# Patient Record
Sex: Female | Born: 1935 | Race: White | Hispanic: No | Smoking: Former smoker
Health system: Southern US, Community
[De-identification: ages and names within clinical notes are randomized; demographics above are authoritative.]

## PROBLEM LIST (undated history)

## (undated) DIAGNOSIS — H25019 Cortical age-related cataract, unspecified eye: Secondary | ICD-10-CM

## (undated) DIAGNOSIS — I10 Essential (primary) hypertension: Secondary | ICD-10-CM

## (undated) DIAGNOSIS — K219 Gastro-esophageal reflux disease without esophagitis: Secondary | ICD-10-CM

## (undated) DIAGNOSIS — E785 Hyperlipidemia, unspecified: Secondary | ICD-10-CM

## (undated) DIAGNOSIS — E119 Type 2 diabetes mellitus without complications: Secondary | ICD-10-CM

## (undated) DIAGNOSIS — I639 Cerebral infarction, unspecified: Secondary | ICD-10-CM

## (undated) DIAGNOSIS — I491 Atrial premature depolarization: Secondary | ICD-10-CM

## (undated) HISTORY — DX: Cortical age-related cataract, unspecified eye: H25.019

## (undated) HISTORY — DX: Type 2 diabetes mellitus without complications: E11.9

## (undated) HISTORY — DX: Hyperlipidemia, unspecified: E78.5

## (undated) HISTORY — PX: TONSILLECTOMY: SUR1361

## (undated) HISTORY — PX: APPENDECTOMY: SHX54

## (undated) HISTORY — DX: Essential (primary) hypertension: I10

## (undated) HISTORY — DX: Atrial premature depolarization: I49.1

## (undated) HISTORY — DX: Gastro-esophageal reflux disease without esophagitis: K21.9

## (undated) HISTORY — PX: CHOLECYSTECTOMY: SHX55

## (undated) HISTORY — PX: FRACTURE SURGERY: SHX138

---

## 2009-10-29 ENCOUNTER — Ambulatory Visit: Payer: Self-pay

## 2010-03-28 ENCOUNTER — Emergency Department: Payer: Self-pay | Admitting: Unknown Physician Specialty

## 2010-03-28 ENCOUNTER — Ambulatory Visit: Payer: Self-pay | Admitting: Pain Medicine

## 2010-04-17 ENCOUNTER — Ambulatory Visit: Payer: Self-pay | Admitting: Pain Medicine

## 2010-06-19 ENCOUNTER — Ambulatory Visit: Payer: Self-pay | Admitting: Pain Medicine

## 2010-06-20 ENCOUNTER — Ambulatory Visit: Payer: Self-pay | Admitting: Pain Medicine

## 2010-07-24 ENCOUNTER — Ambulatory Visit: Payer: Self-pay | Admitting: Internal Medicine

## 2010-08-20 ENCOUNTER — Ambulatory Visit: Payer: Self-pay | Admitting: Ophthalmology

## 2010-10-08 ENCOUNTER — Ambulatory Visit: Payer: Self-pay | Admitting: Ophthalmology

## 2011-04-15 ENCOUNTER — Ambulatory Visit: Payer: Self-pay | Admitting: Pain Medicine

## 2011-04-28 ENCOUNTER — Ambulatory Visit: Payer: Self-pay | Admitting: Pain Medicine

## 2011-05-12 ENCOUNTER — Ambulatory Visit: Payer: Self-pay | Admitting: Pain Medicine

## 2012-01-06 ENCOUNTER — Ambulatory Visit: Payer: Self-pay | Admitting: Internal Medicine

## 2014-05-03 DIAGNOSIS — G609 Hereditary and idiopathic neuropathy, unspecified: Secondary | ICD-10-CM | POA: Insufficient documentation

## 2014-05-03 DIAGNOSIS — R490 Dysphonia: Secondary | ICD-10-CM | POA: Insufficient documentation

## 2014-11-02 DIAGNOSIS — E119 Type 2 diabetes mellitus without complications: Secondary | ICD-10-CM | POA: Insufficient documentation

## 2014-11-02 DIAGNOSIS — E782 Mixed hyperlipidemia: Secondary | ICD-10-CM | POA: Insufficient documentation

## 2015-09-19 DIAGNOSIS — M47816 Spondylosis without myelopathy or radiculopathy, lumbar region: Secondary | ICD-10-CM | POA: Insufficient documentation

## 2015-09-19 DIAGNOSIS — M1611 Unilateral primary osteoarthritis, right hip: Secondary | ICD-10-CM | POA: Insufficient documentation

## 2015-09-19 DIAGNOSIS — M48061 Spinal stenosis, lumbar region without neurogenic claudication: Secondary | ICD-10-CM | POA: Insufficient documentation

## 2016-09-23 ENCOUNTER — Encounter: Payer: Self-pay | Admitting: Podiatry

## 2016-09-23 ENCOUNTER — Ambulatory Visit (INDEPENDENT_AMBULATORY_CARE_PROVIDER_SITE_OTHER): Payer: Medicare Other | Admitting: Podiatry

## 2016-09-23 VITALS — BP 134/78 | HR 83 | Resp 16

## 2016-09-23 DIAGNOSIS — M19079 Primary osteoarthritis, unspecified ankle and foot: Secondary | ICD-10-CM

## 2016-09-23 DIAGNOSIS — Q828 Other specified congenital malformations of skin: Secondary | ICD-10-CM | POA: Diagnosis not present

## 2016-09-23 DIAGNOSIS — L608 Other nail disorders: Secondary | ICD-10-CM | POA: Diagnosis not present

## 2016-09-23 DIAGNOSIS — M79609 Pain in unspecified limb: Secondary | ICD-10-CM

## 2016-09-23 DIAGNOSIS — E0843 Diabetes mellitus due to underlying condition with diabetic autonomic (poly)neuropathy: Secondary | ICD-10-CM

## 2016-09-23 DIAGNOSIS — B351 Tinea unguium: Secondary | ICD-10-CM | POA: Diagnosis not present

## 2016-09-23 DIAGNOSIS — L603 Nail dystrophy: Secondary | ICD-10-CM | POA: Diagnosis not present

## 2016-09-23 MED ORDER — NONFORMULARY OR COMPOUNDED ITEM: 1.0000 g | Freq: Four times a day (QID) | 2 refills | Status: DC

## 2016-09-23 NOTE — Progress Notes (Signed)
   Subjective:    Patient ID: Madison Kennedy, female    DOB: 02/14/1920, 83 y.o.   MRN: [id number]  HPI    Review of Systems  All other systems reviewed and are negative.      Objective:   Physical Exam        Assessment & Plan:

## 2016-09-23 NOTE — Progress Notes (Signed)
Patient ID: Madison AloePhyllis A Kritikos, female   DOB: 11/09/1919, 83 y.o.   MRN: [id number]   SUBJECTIVE Patient with a history of diabetes mellitus presents to office today complaining of elongated, thickened nails. Pain while ambulating in shoes. Patient is unable to trim their own nails.  Patient also complains of a painful callus lesion to the plantar aspect of the first MPJ left foot. Patient states that the calluses been there for several years now. Patient states that it is excessively painful to walk. Patient also complains of generalized foot pain throughout the midfoot of the bilateral lower extremities.  OBJECTIVE General Patient is awake, alert, and oriented x 3 and in no acute distress. Derm painful hyperkeratotic callus lesion noted to the weightbearing surface of the first MPJ left foot with a central nucleated core. Skin is dry and supple bilateral. Negative open lesions or macerations. Remaining integument unremarkable. Nails are tender, long, thickened and dystrophic with subungual debris, consistent with onychomycosis, 1-5 bilateral. No signs of infection noted. Vasc  DP and PT pedal pulses palpable bilaterally. Temperature gradient within normal limits.  Neuro Epicritic and protective threshold sensation diminished bilaterally.  Musculoskeletal Exam generalized foot pain throughout the midfoot of the bilateral lower extremities. No symptomatic pedal deformities noted bilateral. Muscular strength within normal limits.  ASSESSMENT 1. Diabetes Mellitus w/ peripheral neuropathy 2. Onychomycosis of nail due to dermatophyte bilateral 3. DJD with osteoarthritis bilateral midfoot 4. Porokeratosis first MPJ left foot  PLAN OF CARE 1. Patient evaluated today. 2. Instructed to maintain good pedal hygiene and foot care. Stressed importance of controlling blood sugar.  3. Mechanical debridement of nails 1-5 bilaterally performed using a nail nipper. Filed with dremel without incident.  4.  Prescription for anti-inflammatory pain cream dispensed through [facility] 5. Excisional debridement of the painful callus lesion was performed using a chisel blade without incident. Salicylic acid cream applied. 6. Return to clinic in 3 mos.     Felecia ShellingBrent M. Evans, DPM [facility]  Dr. Felecia ShellingBrent M. Evans, DPM    [address]                                        [place], [place] [address]                Office [phone]  Fax [phone]

## 2016-12-22 ENCOUNTER — Ambulatory Visit (INDEPENDENT_AMBULATORY_CARE_PROVIDER_SITE_OTHER): Payer: Medicare Other | Admitting: Podiatry

## 2016-12-22 DIAGNOSIS — M79609 Pain in unspecified limb: Secondary | ICD-10-CM

## 2016-12-22 DIAGNOSIS — B351 Tinea unguium: Secondary | ICD-10-CM

## 2016-12-22 DIAGNOSIS — M19079 Primary osteoarthritis, unspecified ankle and foot: Secondary | ICD-10-CM

## 2016-12-22 NOTE — Progress Notes (Signed)
Complaint:  Visit Type: Patient returns to my office for continued preventative foot care services. Complaint: Patient states" my nails have grown long and thick and become painful to walk and wear shoes" Patient has been diagnosed with DM with no foot complications. The patient presents for preventative foot care services. No changes to ROS.  She previously had pain through the arch both feet which was treated with compounding medicine from  [place]. She has no pain at this visit.     Podiatric Exam: Vascular: dorsalis pedis and posterior tibial pulses are palpable bilateral. Capillary return is immediate. Temperature gradient is WNL. Skin turgor WNL  Sensorium: Normal Semmes Weinstein monofilament test. Normal tactile sensation bilaterally. Nail Exam: Pt has thick disfigured discolored nails with subungual debris noted bilateral entire nail hallux through fifth toenails Ulcer Exam: There is no evidence of ulcer or pre-ulcerative changes or infection. Orthopedic Exam: Muscle tone and strength are WNL. No limitations in general ROM. No crepitus or effusions noted. Mild HAV  B/L  With dorsal DJD  B/L. Skin: No Porokeratosis. No infection or ulcers  Diagnosis:  Onychomycosis, , Pain in right toe, pain in left toes  Treatment & Plan Procedures and Treatment: Consent by patient was obtained for treatment procedures. The patient understood the discussion of treatment and procedures well. All questions were answered thoroughly reviewed. Debridement of mycotic and hypertrophic toenails, 1 through 5 bilateral and clearing of subungual debris. No ulceration, no infection noted. Use cream as needed for pain.   Return Visit-Office Procedure: Patient instructed to return to the office for a follow up visit 3 months for continued evaluation and treatment.    Helane GuntherGregory Karrie Fluellen DPM

## 2017-04-06 ENCOUNTER — Ambulatory Visit (INDEPENDENT_AMBULATORY_CARE_PROVIDER_SITE_OTHER): Payer: Medicare Other | Admitting: Podiatry

## 2017-04-06 DIAGNOSIS — G609 Hereditary and idiopathic neuropathy, unspecified: Secondary | ICD-10-CM

## 2017-04-06 DIAGNOSIS — I491 Atrial premature depolarization: Secondary | ICD-10-CM | POA: Insufficient documentation

## 2017-04-06 DIAGNOSIS — M79609 Pain in unspecified limb: Secondary | ICD-10-CM | POA: Diagnosis not present

## 2017-04-06 DIAGNOSIS — I10 Essential (primary) hypertension: Secondary | ICD-10-CM | POA: Insufficient documentation

## 2017-04-06 DIAGNOSIS — Q828 Other specified congenital malformations of skin: Secondary | ICD-10-CM

## 2017-04-06 DIAGNOSIS — H25019 Cortical age-related cataract, unspecified eye: Secondary | ICD-10-CM | POA: Insufficient documentation

## 2017-04-06 DIAGNOSIS — B351 Tinea unguium: Secondary | ICD-10-CM

## 2017-04-06 DIAGNOSIS — K219 Gastro-esophageal reflux disease without esophagitis: Secondary | ICD-10-CM | POA: Insufficient documentation

## 2017-04-06 NOTE — Progress Notes (Signed)
Complaint:  Visit Type: Patient returns to my office for continued preventative foot care services. Complaint: Patient states" my nails have grown long and thick and become painful to walk and wear shoes" Patient has been diagnosed with DM with no foot complications. The patient presents for preventative foot care services. No changes to ROS.  She previously had pain through the arch both feet which was treated with compounding medicine from  [place]. She has no pain at this visit.     Podiatric Exam: Vascular: dorsalis pedis and posterior tibial pulses are palpable bilateral. Capillary return is immediate. Temperature gradient is WNL. Skin turgor WNL  Sensorium: Normal Semmes Weinstein monofilament test. Normal tactile sensation bilaterally. Nail Exam: Pt has thick disfigured discolored nails with subungual debris noted bilateral entire nail hallux through fifth toenails Ulcer Exam: There is no evidence of ulcer or pre-ulcerative changes or infection. Orthopedic Exam: Muscle tone and strength are WNL. No limitations in general ROM. No crepitus or effusions noted. Mild HAV  B/L  With dorsal DJD  B/L. Skin: No Porokeratosis. No infection or ulcers.  Porokeratosis  Sub 1 left foot.  Diagnosis:  Onychomycosis, , Pain in right toe, pain in left toes  Treatment & Plan Procedures and Treatment: Consent by patient was obtained for treatment procedures. The patient understood the discussion of treatment and procedures well. All questions were answered thoroughly reviewed. Debridement of mycotic and hypertrophic toenails, 1 through 5 bilateral and clearing of subungual debris. No ulceration, no infection noted.  Debridement of porokeratosis left.  Return Visit-Office Procedure: Patient instructed to return to the office for a follow up visit 3 months for continued evaluation and treatment.    Siham Bucaro DPM 

## 2017-04-30 DIAGNOSIS — D649 Anemia, unspecified: Secondary | ICD-10-CM | POA: Insufficient documentation

## 2017-07-13 ENCOUNTER — Ambulatory Visit (INDEPENDENT_AMBULATORY_CARE_PROVIDER_SITE_OTHER): Payer: Medicare Other | Admitting: Podiatry

## 2017-07-13 ENCOUNTER — Encounter: Payer: Self-pay | Admitting: Podiatry

## 2017-07-13 DIAGNOSIS — Q828 Other specified congenital malformations of skin: Secondary | ICD-10-CM | POA: Diagnosis not present

## 2017-07-13 DIAGNOSIS — M79609 Pain in unspecified limb: Secondary | ICD-10-CM

## 2017-07-13 DIAGNOSIS — E119 Type 2 diabetes mellitus without complications: Secondary | ICD-10-CM | POA: Diagnosis not present

## 2017-07-13 DIAGNOSIS — B351 Tinea unguium: Secondary | ICD-10-CM | POA: Diagnosis not present

## 2017-07-13 NOTE — Progress Notes (Signed)
Complaint:  Visit Type: Patient returns to my office for continued preventative foot care services. Complaint: Patient states" my nails have grown long and thick and become painful to walk and wear shoes" Patient has been diagnosed with DM with no foot complications. The patient presents for preventative foot care services. No changes to ROS.  She previously had pain through the arch both feet which was treated with compounding medicine from  [place]. She has no pain at this visit.     Podiatric Exam: Vascular: dorsalis pedis and posterior tibial pulses are palpable bilateral. Capillary return is immediate. Temperature gradient is WNL. Skin turgor WNL  Sensorium: Normal Semmes Weinstein monofilament test. Normal tactile sensation bilaterally. Nail Exam: Pt has thick disfigured discolored nails with subungual debris noted bilateral entire nail hallux through fifth toenails Ulcer Exam: There is no evidence of ulcer or pre-ulcerative changes or infection. Orthopedic Exam: Muscle tone and strength are WNL. No limitations in general ROM. No crepitus or effusions noted. Mild HAV  B/L  With dorsal DJD  B/L. Skin: No Porokeratosis. No infection or ulcers.  Porokeratosis  Sub 1 left foot.  Diagnosis:  Onychomycosis, , Pain in right toe, pain in left toes  Treatment & Plan Procedures and Treatment: Consent by patient was obtained for treatment procedures. The patient understood the discussion of treatment and procedures well. All questions were answered thoroughly reviewed. Debridement of mycotic and hypertrophic toenails, 1 through 5 bilateral and clearing of subungual debris. No ulceration, no infection noted.  Debridement of porokeratosis left.  Return Visit-Office Procedure: Patient instructed to return to the office for a follow up visit 3 months for continued evaluation and treatment.    Helane GuntherGregory Anshika Pethtel DPM

## 2017-10-12 ENCOUNTER — Ambulatory Visit (INDEPENDENT_AMBULATORY_CARE_PROVIDER_SITE_OTHER): Payer: Medicare Other | Admitting: Podiatry

## 2017-10-12 ENCOUNTER — Encounter: Payer: Self-pay | Admitting: Podiatry

## 2017-10-12 DIAGNOSIS — Q828 Other specified congenital malformations of skin: Secondary | ICD-10-CM

## 2017-10-12 DIAGNOSIS — M79609 Pain in unspecified limb: Secondary | ICD-10-CM | POA: Diagnosis not present

## 2017-10-12 DIAGNOSIS — E119 Type 2 diabetes mellitus without complications: Secondary | ICD-10-CM

## 2017-10-12 DIAGNOSIS — B351 Tinea unguium: Secondary | ICD-10-CM

## 2017-10-12 NOTE — Progress Notes (Signed)
Complaint:  Visit Type: Patient returns to my office for continued preventative foot care services. Complaint: Patient states" my nails have grown long and thick and become painful to walk and wear shoes" Patient has been diagnosed with DM with no foot complications. The patient presents for preventative foot care services. No changes to ROS.  She previously had pain through the arch both feet which was treated with compounding medicine from  [place]. She has no pain at this visit.     Podiatric Exam: Vascular: dorsalis pedis and posterior tibial pulses are palpable bilateral. Capillary return is immediate. Temperature gradient is WNL. Skin turgor WNL  Sensorium: Normal Semmes Weinstein monofilament test. Normal tactile sensation bilaterally. Nail Exam: Pt has thick disfigured discolored nails with subungual debris noted bilateral entire nail hallux through fifth toenails Ulcer Exam: There is no evidence of ulcer or pre-ulcerative changes or infection. Orthopedic Exam: Muscle tone and strength are WNL. No limitations in general ROM. No crepitus or effusions noted. Mild HAV  B/L  With dorsal DJD  B/L. Skin: No Porokeratosis. No infection or ulcers.  Porokeratosis  Sub 1 left foot.  Diagnosis:  Onychomycosis, , Pain in right toe, pain in left toes  Treatment & Plan Procedures and Treatment: Consent by patient was obtained for treatment procedures. The patient understood the discussion of treatment and procedures well. All questions were answered thoroughly reviewed. Debridement of mycotic and hypertrophic toenails, 1 through 5 bilateral and clearing of subungual debris. No ulceration, no infection noted. Patient needs padding across the met heads  B/L.  Return Visit-Office Procedure: Patient instructed to return to the office for a follow up visit 3 months for continued evaluation and treatment.    Gardiner Barefoot DPM

## 2017-11-12 ENCOUNTER — Inpatient Hospital Stay (HOSPITAL_COMMUNITY)
Admission: EM | Admit: 2017-11-12 | Discharge: 2017-11-17 | DRG: 062 | Disposition: A | Discharge status: Skilled Nursing Facility | Payer: Medicare Other | Attending: Neurology | Admitting: Neurology

## 2017-11-12 ENCOUNTER — Emergency Department (HOSPITAL_COMMUNITY): Payer: Medicare Other

## 2017-11-12 ENCOUNTER — Other Ambulatory Visit: Payer: Self-pay

## 2017-11-12 ENCOUNTER — Encounter (HOSPITAL_COMMUNITY): Payer: Self-pay | Admitting: Emergency Medicine

## 2017-11-12 DIAGNOSIS — I639 Cerebral infarction, unspecified: Secondary | ICD-10-CM | POA: Diagnosis not present

## 2017-11-12 DIAGNOSIS — Y828 Other medical devices associated with adverse incidents: Secondary | ICD-10-CM | POA: Diagnosis not present

## 2017-11-12 DIAGNOSIS — Z87891 Personal history of nicotine dependence: Secondary | ICD-10-CM | POA: Diagnosis not present

## 2017-11-12 DIAGNOSIS — Z789 Other specified health status: Secondary | ICD-10-CM

## 2017-11-12 DIAGNOSIS — Z885 Allergy status to narcotic agent status: Secondary | ICD-10-CM | POA: Diagnosis not present

## 2017-11-12 DIAGNOSIS — G8194 Hemiplegia, unspecified affecting left nondominant side: Secondary | ICD-10-CM | POA: Diagnosis present

## 2017-11-12 DIAGNOSIS — E1136 Type 2 diabetes mellitus with diabetic cataract: Secondary | ICD-10-CM | POA: Diagnosis present

## 2017-11-12 DIAGNOSIS — E1142 Type 2 diabetes mellitus with diabetic polyneuropathy: Secondary | ICD-10-CM | POA: Diagnosis present

## 2017-11-12 DIAGNOSIS — E782 Mixed hyperlipidemia: Secondary | ICD-10-CM | POA: Diagnosis present

## 2017-11-12 DIAGNOSIS — R471 Dysarthria and anarthria: Secondary | ICD-10-CM | POA: Diagnosis present

## 2017-11-12 DIAGNOSIS — N390 Urinary tract infection, site not specified: Secondary | ICD-10-CM | POA: Diagnosis not present

## 2017-11-12 DIAGNOSIS — R918 Other nonspecific abnormal finding of lung field: Secondary | ICD-10-CM | POA: Diagnosis not present

## 2017-11-12 DIAGNOSIS — E860 Dehydration: Secondary | ICD-10-CM

## 2017-11-12 DIAGNOSIS — I672 Cerebral atherosclerosis: Secondary | ICD-10-CM | POA: Diagnosis present

## 2017-11-12 DIAGNOSIS — I63421 Cerebral infarction due to embolism of right anterior cerebral artery: Secondary | ICD-10-CM | POA: Diagnosis present

## 2017-11-12 DIAGNOSIS — E1122 Type 2 diabetes mellitus with diabetic chronic kidney disease: Secondary | ICD-10-CM | POA: Diagnosis present

## 2017-11-12 DIAGNOSIS — I4891 Unspecified atrial fibrillation: Secondary | ICD-10-CM | POA: Diagnosis present

## 2017-11-12 DIAGNOSIS — R29708 NIHSS score 8: Secondary | ICD-10-CM | POA: Diagnosis present

## 2017-11-12 DIAGNOSIS — K219 Gastro-esophageal reflux disease without esophagitis: Secondary | ICD-10-CM | POA: Diagnosis present

## 2017-11-12 DIAGNOSIS — Y92230 Patient room in hospital as the place of occurrence of the external cause: Secondary | ICD-10-CM | POA: Diagnosis not present

## 2017-11-12 DIAGNOSIS — I63321 Cerebral infarction due to thrombosis of right anterior cerebral artery: Secondary | ICD-10-CM | POA: Diagnosis not present

## 2017-11-12 DIAGNOSIS — N3000 Acute cystitis without hematuria: Secondary | ICD-10-CM | POA: Diagnosis not present

## 2017-11-12 DIAGNOSIS — D72829 Elevated white blood cell count, unspecified: Secondary | ICD-10-CM | POA: Diagnosis present

## 2017-11-12 DIAGNOSIS — R29810 Facial weakness: Secondary | ICD-10-CM | POA: Diagnosis present

## 2017-11-12 DIAGNOSIS — E1159 Type 2 diabetes mellitus with other circulatory complications: Secondary | ICD-10-CM | POA: Diagnosis not present

## 2017-11-12 DIAGNOSIS — Z888 Allergy status to other drugs, medicaments and biological substances status: Secondary | ICD-10-CM

## 2017-11-12 DIAGNOSIS — N184 Chronic kidney disease, stage 4 (severe): Secondary | ICD-10-CM | POA: Diagnosis present

## 2017-11-12 DIAGNOSIS — I10 Essential (primary) hypertension: Secondary | ICD-10-CM | POA: Diagnosis not present

## 2017-11-12 DIAGNOSIS — I129 Hypertensive chronic kidney disease with stage 1 through stage 4 chronic kidney disease, or unspecified chronic kidney disease: Secondary | ICD-10-CM | POA: Diagnosis present

## 2017-11-12 DIAGNOSIS — R509 Fever, unspecified: Secondary | ICD-10-CM

## 2017-11-12 DIAGNOSIS — D631 Anemia in chronic kidney disease: Secondary | ICD-10-CM | POA: Diagnosis present

## 2017-11-12 DIAGNOSIS — R4701 Aphasia: Secondary | ICD-10-CM | POA: Diagnosis present

## 2017-11-12 DIAGNOSIS — Z7984 Long term (current) use of oral hypoglycemic drugs: Secondary | ICD-10-CM

## 2017-11-12 DIAGNOSIS — J181 Lobar pneumonia, unspecified organism: Secondary | ICD-10-CM | POA: Diagnosis not present

## 2017-11-12 DIAGNOSIS — T8029XA Infection following other infusion, transfusion and therapeutic injection, initial encounter: Secondary | ICD-10-CM | POA: Diagnosis not present

## 2017-11-12 DIAGNOSIS — Z79899 Other long term (current) drug therapy: Secondary | ICD-10-CM

## 2017-11-12 DIAGNOSIS — I340 Nonrheumatic mitral (valve) insufficiency: Secondary | ICD-10-CM | POA: Diagnosis not present

## 2017-11-12 DIAGNOSIS — I6300 Cerebral infarction due to thrombosis of unspecified precerebral artery: Secondary | ICD-10-CM | POA: Diagnosis not present

## 2017-11-12 DIAGNOSIS — I480 Paroxysmal atrial fibrillation: Secondary | ICD-10-CM | POA: Diagnosis not present

## 2017-11-12 DIAGNOSIS — E785 Hyperlipidemia, unspecified: Secondary | ICD-10-CM | POA: Diagnosis not present

## 2017-11-12 HISTORY — DX: Type 2 diabetes mellitus without complications: E11.9

## 2017-11-12 HISTORY — DX: Essential (primary) hypertension: I10

## 2017-11-12 LAB — CBC
HCT: 33.3 % — ABNORMAL LOW (ref 36.0–46.0)
Hemoglobin: 11.1 g/dL — ABNORMAL LOW (ref 12.0–15.0)
MCH: 31.3 pg (ref 26.0–34.0)
MCHC: 33.3 g/dL (ref 30.0–36.0)
MCV: 93.8 fL (ref 78.0–100.0)
Platelets: 252 10*3/uL (ref 150–400)
RBC: 3.55 MIL/uL — ABNORMAL LOW (ref 3.87–5.11)
RDW: 16.3 % — ABNORMAL HIGH (ref 11.5–15.5)
WBC: 13 10*3/uL — ABNORMAL HIGH (ref 4.0–10.5)

## 2017-11-12 LAB — DIFFERENTIAL
Basophils Absolute: 0 10*3/uL (ref 0.0–0.1)
Basophils Relative: 0 %
Eosinophils Absolute: 0.1 10*3/uL (ref 0.0–0.7)
Eosinophils Relative: 1 %
Lymphocytes Relative: 17 %
Lymphs Abs: 2.2 10*3/uL (ref 0.7–4.0)
Monocytes Absolute: 0.5 10*3/uL (ref 0.1–1.0)
Monocytes Relative: 4 %
Neutro Abs: 10.2 10*3/uL — ABNORMAL HIGH (ref 1.7–7.7)
Neutrophils Relative %: 78 %

## 2017-11-12 LAB — RAPID URINE DRUG SCREEN, HOSP PERFORMED
Amphetamines: NOT DETECTED
Barbiturates: NOT DETECTED
Benzodiazepines: NOT DETECTED
Cocaine: NOT DETECTED
Opiates: NOT DETECTED
Tetrahydrocannabinol: NOT DETECTED

## 2017-11-12 LAB — COMPREHENSIVE METABOLIC PANEL
ALT: 15 U/L (ref 14–54)
AST: 24 U/L (ref 15–41)
Albumin: 3.2 g/dL — ABNORMAL LOW (ref 3.5–5.0)
Alkaline Phosphatase: 86 U/L (ref 38–126)
Anion gap: 12 (ref 5–15)
BUN: 40 mg/dL — ABNORMAL HIGH (ref 6–20)
CO2: 23 mmol/L (ref 22–32)
Calcium: 9.1 mg/dL (ref 8.9–10.3)
Chloride: 107 mmol/L (ref 101–111)
Creatinine, Ser: 1.74 mg/dL — ABNORMAL HIGH (ref 0.44–1.00)
GFR calc Af Amer: 28 mL/min — ABNORMAL LOW (ref >60)
GFR calc non Af Amer: 24 mL/min — ABNORMAL LOW (ref >60)
Glucose, Bld: 92 mg/dL (ref 65–99)
Potassium: 4 mmol/L (ref 3.5–5.1)
Sodium: 142 mmol/L (ref 135–145)
Total Bilirubin: 0.7 mg/dL (ref 0.3–1.2)
Total Protein: 6 g/dL — ABNORMAL LOW (ref 6.5–8.1)

## 2017-11-12 LAB — I-STAT CHEM 8, ED
BUN: 37 mg/dL — ABNORMAL HIGH (ref 6–20)
Calcium, Ion: 1.12 mmol/L — ABNORMAL LOW (ref 1.15–1.40)
Chloride: 107 mmol/L (ref 101–111)
Creatinine, Ser: 1.6 mg/dL — ABNORMAL HIGH (ref 0.44–1.00)
Glucose, Bld: 90 mg/dL (ref 65–99)
HCT: 34 % — ABNORMAL LOW (ref 36.0–46.0)
Hemoglobin: 11.6 g/dL — ABNORMAL LOW (ref 12.0–15.0)
Potassium: 4 mmol/L (ref 3.5–5.1)
Sodium: 141 mmol/L (ref 135–145)
TCO2: 23 mmol/L (ref 22–32)

## 2017-11-12 LAB — I-STAT TROPONIN, ED: Troponin i, poc: 0.07 ng/mL (ref 0.00–0.08)

## 2017-11-12 LAB — CBG MONITORING, ED: Glucose-Capillary: 86 mg/dL (ref 65–99)

## 2017-11-12 LAB — GLUCOSE, CAPILLARY
GLUCOSE-CAPILLARY: 170 mg/dL — AB (ref 65–99)
GLUCOSE-CAPILLARY: 100 mg/dL — AB (ref 65–99)
Glucose-Capillary: 98 mg/dL (ref 65–99)

## 2017-11-12 LAB — URINALYSIS, ROUTINE W REFLEX MICROSCOPIC
Bacteria, UA: NONE SEEN
Bilirubin Urine: NEGATIVE
Glucose, UA: NEGATIVE mg/dL
Hgb urine dipstick: NEGATIVE
Ketones, ur: NEGATIVE mg/dL
Leukocytes, UA: NEGATIVE
Nitrite: NEGATIVE
Protein, ur: 30 mg/dL — AB
Specific Gravity, Urine: 1.041 — ABNORMAL HIGH (ref 1.005–1.030)
pH: 6 (ref 5.0–8.0)

## 2017-11-12 LAB — ETHANOL: Alcohol, Ethyl (B): <10 mg/dL (ref <10)

## 2017-11-12 LAB — APTT: aPTT: 33 seconds (ref 24–36)

## 2017-11-12 LAB — MRSA PCR SCREENING: MRSA by PCR: NEGATIVE

## 2017-11-12 LAB — PROTIME-INR
INR: 1.12
Prothrombin Time: 14.3 seconds (ref 11.4–15.2)

## 2017-11-12 MED ORDER — SODIUM CHLORIDE 0.9 % IV SOLN
50.0000 mL/h | INTRAVENOUS | Status: DC
  Administered 2017-11-12 (×2): 50 mL/h via INTRAVENOUS

## 2017-11-12 MED ORDER — ALTEPLASE (STROKE) FULL DOSE INFUSION
0.9000 mg/kg | Freq: Once | INTRAVENOUS | Status: AC
  Administered 2017-11-12: 60.1 mg via INTRAVENOUS

## 2017-11-12 MED ORDER — LABETALOL HCL 5 MG/ML IV SOLN
10.0000 mg | Freq: Once | INTRAVENOUS | Status: AC
  Administered 2017-11-12: 10 mg via INTRAVENOUS

## 2017-11-12 MED ORDER — ACETAMINOPHEN 650 MG RE SUPP: 650.0000 mg | RECTAL | Status: DC | PRN

## 2017-11-12 MED ORDER — IOPAMIDOL (ISOVUE-370) INJECTION 76%
INTRAVENOUS | Status: AC
  Administered 2017-11-12: 90 mL
  Filled 2017-11-12: qty 100

## 2017-11-12 MED ORDER — ACETAMINOPHEN 160 MG/5ML PO SOLN: 650.0000 mg | ORAL | Status: DC | PRN

## 2017-11-12 MED ORDER — CLEVIDIPINE BUTYRATE 0.5 MG/ML IV EMUL
0.0000 mg/h | INTRAVENOUS | Status: DC | PRN
Start: 2017-11-12 — End: 2017-11-13
  Administered 2017-11-12: 7 mg/h via INTRAVENOUS
  Administered 2017-11-12: 2 mg/h via INTRAVENOUS
  Administered 2017-11-13 (×2): 7 mg/h via INTRAVENOUS
  Filled 2017-11-12 (×4): qty 50

## 2017-11-12 MED ORDER — STROKE: EARLY STAGES OF RECOVERY BOOK
Freq: Once | Status: AC
  Administered 2017-11-12: 13:00:00
  Filled 2017-11-12: qty 1

## 2017-11-12 MED ORDER — PANTOPRAZOLE SODIUM 40 MG IV SOLR
40.0000 mg | Freq: Every day | INTRAVENOUS | Status: DC
  Administered 2017-11-12 – 2017-11-13 (×2): 40 mg via INTRAVENOUS
  Filled 2017-11-12 (×2): qty 40

## 2017-11-12 MED ORDER — ACETAMINOPHEN 325 MG PO TABS: 650.0000 mg | ORAL_TABLET | ORAL | Status: DC | PRN

## 2017-11-12 MED ORDER — INSULIN ASPART 100 UNIT/ML ~~LOC~~ SOLN
0.0000 [IU] | Freq: Three times a day (TID) | SUBCUTANEOUS | Status: DC
  Administered 2017-11-12 – 2017-11-13 (×3): 3 [IU] via SUBCUTANEOUS
  Administered 2017-11-14: 5 [IU] via SUBCUTANEOUS
  Administered 2017-11-15: 2 [IU] via SUBCUTANEOUS
  Administered 2017-11-15: 5 [IU] via SUBCUTANEOUS
  Administered 2017-11-15 – 2017-11-16 (×4): 3 [IU] via SUBCUTANEOUS
  Administered 2017-11-17 (×3): 2 [IU] via SUBCUTANEOUS

## 2017-11-12 NOTE — H&P (Signed)
Neurology H&P  CC: Confusion  History is obtained from: Patient  HPI: Madison Kennedy is a 84 y.o. female with a history of ?Atrial fibrillation(family thinks she has it and is on diltiazem for it, but is not sure).   She is not on any anticoagulation or antiplatelet therapy. She was feeling ill yesterday, felt to be due to constipation and was started on a suppository with results. She felt significantly better this morning initially, however shortly after getting up, she  [place].   She then had significant difficulty getting up   LKW: 8 AM tpa given?:  Yes Modified Rankin Scale: 1-No significant post stroke disability and can perform usual duties with stroke symptoms  ROS: Unable to obtain due to altered mental status.   Past Medical History:  Diagnosis Date  . Diabetes mellitus without complication (HCC)   . Hypertension      Family history: No history of stroke   Social History:  reports that she has quit smoking. She has never used smokeless tobacco. She reports that she does not use drugs. Her alcohol history is not on file. She has a long history of smoking and started cutting back around the age of 32.  Exam: Current vital signs: BP (!) 179/80   Pulse 82   Temp 98.6 F (37 C) (Oral)   Resp 12   Wt 66.8 kg (147 lb 4.3 oz)   SpO2 98%  Vital signs in last 24 hours: Temp:  [97.9 F (36.6 C)-98.6 F (37 C)] 98.6 F (37 C) (04/04 1105) Pulse Rate:  [76-82] 82 (04/04 1145) Resp:  [12-21] 12 (04/04 1145) BP: (162-195)/(71-83) 179/80 (04/04 1145) SpO2:  [97 %-98 %] 98 % (04/04 1145) Weight:  [66.8 kg (147 lb 4.3 oz)] 66.8 kg (147 lb 4.3 oz) (04/04 1000)  Physical Exam  Constitutional: Appears well-developed and well-nourished.  Psych: Affect appropriate to situation Eyes: No scleral injection HENT: No OP obstrucion Head: Normocephalic.  Cardiovascular: Normal rate and regular rhythm.  Respiratory: Effort normal and breath sounds normal to anterior  ascultation GI: Soft.  No distension. There is no tenderness.  Skin: WDI  Neuro: Mental Status: Patient is awake, alert, she is very confused, has some perseverative speech, appears aphasic. Cranial Nerves: II: Visual Fields are full. Pupils are equal, round, and reactive to light.   III,IV, VI: EOMI without ptosis or diploplia.  V: Facial sensation is symmetric to temperature VII: Facial movement with left facial weakness VIII: hearing is intact to voice X: Uvula elevates symmetrically XI: Shoulder shrug is symmetric. XII: tongue is midline without atrophy or fasciculations.  Motor: She has increased tone in her left arm, but is able to hold it against gravity, 3/5 weakness in the left leg. 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  Given that it was an A2 occlusion, I discussed intervention with her family, but given  her age and the relatively small amount of penumbra seen on CT perfusion, did not recommend intervention and they agreed.  I do not have any of her records to review for possible atrial fibrillation,  Recommendations: 1. HgbA1c, fasting lipid panel 2. MRI of the brain without contrast 3. Frequent neuro checks 4. Echocardiogram 5.  SSI for DM 6. Prophylactic therapy-none for 24 hours 7. Risk factor modification 8. Telemetry monitoring 9. PT  consult, OT consult, Speech consult 10.  Request outside records for confirmation of A. fib  11. please page stroke NP  Or  PA  Or MD  from 8am -4 pm as this patient will be followed by the stroke team at this point.   You can look them up on [email or web address]      This patient is critically ill and at significant risk of neurological worsening, death and care requires constant monitoring of vital signs, hemodynamics,respiratory and cardiac monitoring, neurological assessment, discussion with family, other specialists and medical decision making of high complexity. I spent 50 minutes of neurocritical care time  in the care of  this patient.  Ritta SlotMcNeill Partick Musselman, MD [facility] [phone]  If 7pm- 7am, please page neurology on call as listed in AMION. 11/12/2017  12:09 PM

## 2017-11-12 NOTE — Progress Notes (Signed)
Pharmacist Code Stroke Response  Notified to mix tPA at 0945 by Dr. Amada JupiterKirkpatrick Delivered tPA to RN at [phone]  Issues/delays encountered (if applicable): None   Vinnie LevelBenjamin Va Broadwell, PharmD., BCPS Clinical Pharmacist Clinical phone for 11/12/17 until 3:30pm: [phone]

## 2017-11-12 NOTE — ED Notes (Signed)
Family at the bedside.

## 2017-11-12 NOTE — Code Documentation (Signed)
84 yo Female coming from home where she was with her daughter this morning. Pt woke up at 0800 and was normal per baseline. Shortly after waking up, pt started to have left arm and left leg weakness, Facial droop, and trouble speaking. EMS was called and Code Stroke activated. Stroke Team met patient at the bridge. Pt taken to CT. CT, CTA, and CTP completed. Initial NIHSS 9 due to answering both questions inappropriately, left facial palsy, left leg can't resist against gravity, severe aphasia, and mild dysarthria.  tPA started at 0953. Delays due to conversation by MD kirkpatrick with family to decide LNW and medical hx- chart had no hx present, IV access took multiple sticks, and MD Leonel Ramsay had to have a second conversation with family about treatment plan. Pt has tPA running, NIHSS decreasing. Pt admitted to [facility] by Stroke service. Velna Hatchet, RN ED nurse and gave report to [facility] ICU.

## 2017-11-12 NOTE — Plan of Care (Signed)
Pt passed RN stroke swallow and is cleared for a carb modified diet.  Will continue to monitor.  Heloise PurpuraSusan Letta Cargile RN

## 2017-11-12 NOTE — ED Triage Notes (Signed)
EMS- Pt LKW 0800 pts daughter reports that patient had changes when getting up this morning. Pt with repetitive words and facial drop on left.

## 2017-11-13 ENCOUNTER — Inpatient Hospital Stay (HOSPITAL_COMMUNITY): Payer: Medicare Other

## 2017-11-13 DIAGNOSIS — I63321 Cerebral infarction due to thrombosis of right anterior cerebral artery: Secondary | ICD-10-CM

## 2017-11-13 DIAGNOSIS — I340 Nonrheumatic mitral (valve) insufficiency: Secondary | ICD-10-CM

## 2017-11-13 LAB — LIPID PANEL
CHOL/HDL RATIO: 5.3 ratio
CHOLESTEROL: 192 mg/dL (ref 0–200)
HDL: 36 mg/dL — ABNORMAL LOW (ref >40)
LDL Cholesterol: 116 mg/dL — ABNORMAL HIGH (ref 0–99)
Triglycerides: 199 mg/dL — ABNORMAL HIGH (ref <150)
VLDL: 40 mg/dL (ref 0–40)

## 2017-11-13 LAB — CBC
HCT: 30.7 % — ABNORMAL LOW (ref 36.0–46.0)
HEMOGLOBIN: 9.7 g/dL — AB (ref 12.0–15.0)
MCH: 29.8 pg (ref 26.0–34.0)
MCHC: 31.6 g/dL (ref 30.0–36.0)
MCV: 94.2 fL (ref 78.0–100.0)
PLATELETS: 215 10*3/uL (ref 150–400)
RBC: 3.26 MIL/uL — AB (ref 3.87–5.11)
RDW: 15.9 % — ABNORMAL HIGH (ref 11.5–15.5)
WBC: 8.1 10*3/uL (ref 4.0–10.5)

## 2017-11-13 LAB — ECHOCARDIOGRAM COMPLETE
Height: 65 in
Weight: 2116.42 oz

## 2017-11-13 LAB — GLUCOSE, CAPILLARY
GLUCOSE-CAPILLARY: 110 mg/dL — AB (ref 65–99)
GLUCOSE-CAPILLARY: 162 mg/dL — AB (ref 65–99)
Glucose-Capillary: 167 mg/dL — ABNORMAL HIGH (ref 65–99)
Glucose-Capillary: 191 mg/dL — ABNORMAL HIGH (ref 65–99)

## 2017-11-13 LAB — HEMOGLOBIN A1C
HEMOGLOBIN A1C: 7.3 % — AB (ref 4.8–5.6)
Mean Plasma Glucose: 162.81 mg/dL

## 2017-11-13 LAB — TRIGLYCERIDES: TRIGLYCERIDES: 189 mg/dL — AB (ref <150)

## 2017-11-13 MED ORDER — ASPIRIN EC 81 MG PO TBEC
81.0000 mg | DELAYED_RELEASE_TABLET | Freq: Every day | ORAL | Status: DC
  Administered 2017-11-13 – 2017-11-15 (×3): 81 mg via ORAL
  Filled 2017-11-13 (×4): qty 1

## 2017-11-13 MED ORDER — SERTRALINE HCL 50 MG PO TABS
50.0000 mg | ORAL_TABLET | Freq: Every day | ORAL | Status: DC
  Administered 2017-11-13 – 2017-11-17 (×5): 50 mg via ORAL
  Filled 2017-11-13 (×5): qty 1

## 2017-11-13 MED ORDER — GLIPIZIDE 5 MG PO TABS: 5.0000 mg | ORAL_TABLET | Freq: Every day | ORAL | Status: DC | PRN

## 2017-11-13 MED ORDER — ALOGLIPTIN BENZOATE 12.5 MG PO TABS: 12.5000 mg | ORAL_TABLET | Freq: Every day | ORAL | Status: DC

## 2017-11-13 MED ORDER — LINAGLIPTIN 5 MG PO TABS
5.0000 mg | ORAL_TABLET | Freq: Every day | ORAL | Status: DC
  Administered 2017-11-13 – 2017-11-17 (×5): 5 mg via ORAL
  Filled 2017-11-13 (×5): qty 1

## 2017-11-13 MED ORDER — BISACODYL 10 MG RE SUPP
10.0000 mg | Freq: Every day | RECTAL | Status: DC | PRN
  Administered 2017-11-17: 10 mg via RECTAL
  Filled 2017-11-13: qty 1

## 2017-11-13 MED ORDER — DILTIAZEM HCL ER BEADS 180 MG PO CP24
180.0000 mg | ORAL_CAPSULE | Freq: Every day | ORAL | Status: DC
  Filled 2017-11-13: qty 1

## 2017-11-13 MED ORDER — DILTIAZEM HCL ER COATED BEADS 180 MG PO CP24
180.0000 mg | ORAL_CAPSULE | Freq: Every day | ORAL | Status: DC
  Administered 2017-11-13 – 2017-11-17 (×5): 180 mg via ORAL
  Filled 2017-11-13 (×4): qty 1

## 2017-11-13 MED ORDER — GLIMEPIRIDE 4 MG PO TABS: 6.0000 mg | ORAL_TABLET | ORAL | Status: DC

## 2017-11-13 MED ORDER — ATORVASTATIN CALCIUM 10 MG PO TABS
10.0000 mg | ORAL_TABLET | Freq: Every day | ORAL | Status: DC
  Administered 2017-11-13 – 2017-11-17 (×5): 10 mg via ORAL
  Filled 2017-11-13 (×5): qty 1

## 2017-11-13 NOTE — Progress Notes (Signed)
Initial Nutrition Assessment  DOCUMENTATION CODES:   Not applicable  INTERVENTION:   Encourage intake at meals Supplement as appropriate    NUTRITION DIAGNOSIS:   Increased nutrient needs related to (with participation in therapies) as evidenced by estimated needs.  GOAL:   Patient will meet greater than or equal to 90% of their needs  MONITOR:   PO intake, Weight trends  REASON FOR ASSESSMENT:   Malnutrition Screening Tool    ASSESSMENT:   Pt with PMH of DM and HTN admitted from her SNF with acute ACA infarct who did not require any intervention.    Pt discussed during ICU rounds and with RN.  Per RN pt is very independent at baseline but L side now flaccid.  Pt having procedure at time of visit. No family present.  Per RN pt ate well at Breakfast this am.  Per MST pt has lost weight unintentionally but has a good appetite, unable to confirm this.   Lab Results  Component Value Date   HGBA1C 7.3 (H) 11/13/2017      NUTRITION - FOCUSED PHYSICAL EXAM:   Unable to complete Nutrition-Focused physical exam at this time.    Diet Order:  Fall precautions Diet Carb Modified Fluid consistency: Thin; Room service appropriate? Yes  EDUCATION NEEDS:   No education needs have been identified at this time  Skin:  Skin Assessment: Reviewed RN Assessment  Last BM:  4/3  Height:   Ht Readings from Last 1 Encounters:  11/12/17 5\' 5"  (1.651 m)    Weight:   Wt Readings from Last 1 Encounters:  11/12/17 132 lb 4.4 oz (60 kg)    Ideal Body Weight:  56.8 kg  BMI:  Body mass index is 22.01 kg/m.  Estimated Nutritional Needs:   Kcal:  1300-1500  Protein:  60-75 grams  Fluid:  >1.5 L/day  Kendell BaneHeather Kerrie Timm RD, LDN, CNSC [phone] Pager [phone] After Hours Pager

## 2017-11-13 NOTE — Progress Notes (Signed)
STROKE TEAM PROGRESS NOTE  Madison Kennedy is a 84 y.o. female with a history of ?Atrial fibrillation(family thinks she has it and is on diltiazem for it, but is not sure).   She is not on any anticoagulation or antiplatelet therapy. She was feeling ill yesterday, felt to be due to constipation and was started on a suppository with results. She felt significantly better this morning initially, however shortly after getting up, she  [place]. She then had significant difficulty getting up  LKW: 8 AM on 11/12/17 tpa given?:  Yes Modified Rankin Scale: 1-No significant post stroke disability and can perform usual duties with stroke symptoms   INTERVAL HISTORY Her daughter and SIL are at the bedside.  Patient in bed, awakens easily to speech. She will be 84 yo on Monday.  CBC:  CBC Latest Ref Rng & Units 11/13/2017 11/12/2017 11/12/2017  WBC 4.0 - 10.5 K/uL SPECIMEN CLOTTED - 13.0(H)  Hemoglobin 12.0 - 15.0 g/dL SPECIMEN CLOTTED 11.6(L) 11.1(L)  Hematocrit 36.0 - 46.0 % SPECIMEN CLOTTED 34.0(L) 33.3(L)  Platelets 150 - 400 K/uL SPECIMEN CLOTTED - 252     Comprehensive Metabolic Panel:   CMP Latest Ref Rng & Units 11/12/2017 11/12/2017  Glucose 65 - 99 mg/dL 90 92  BUN 6 - 20 mg/dL 54(U37(H) 98(J40(H)  Creatinine 0.44 - 1.00 mg/dL 1.91(Y1.60(H) 7.82(N1.74(H)  Sodium 135 - 145 mmol/L 141 142  Potassium 3.5 - 5.1 mmol/L 4.0 4.0  Chloride 101 - 111 mmol/L 107 107  CO2 22 - 32 mmol/L - 23  Calcium 8.9 - 10.3 mg/dL - 9.1  Total Protein 6.5 - 8.1 g/dL - 6.0(L)  Total Bilirubin 0.3 - 1.2 mg/dL - 0.7  Alkaline Phos 38 - 126 U/L - 86  AST 15 - 41 U/L - 24  ALT 14 - 54 U/L - 15   Lipid Panel:     Component Value Date/Time   CHOL 192 11/13/2017 0443   TRIG 199 (H) 11/13/2017 0443   TRIG 189 (H) 11/13/2017 0443   HDL 36 (L) 11/13/2017 0443   CHOLHDL 5.3 11/13/2017 0443   VLDL 40 11/13/2017 0443   LDLCALC 116 (H) 11/13/2017 0443   HgbA1c: No results found for: HGBA1C Urine Drug Screen:     Component Value Date/Time    LABOPIA NONE DETECTED 11/12/2017 1646   COCAINSCRNUR NONE DETECTED 11/12/2017 1646   LABBENZ NONE DETECTED 11/12/2017 1646   AMPHETMU NONE DETECTED 11/12/2017 1646   THCU NONE DETECTED 11/12/2017 1646   LABBARB NONE DETECTED 11/12/2017 1646    Alcohol Level     Component Value Date/Time   ETH <10 11/12/2017 0918    Vitals:   11/13/17 0700 11/13/17 0800 11/13/17 0900 11/13/17 0910  BP: (!) 150/61 (!) 177/80 (!) 196/93 (!) 172/78  Pulse: 92 100 97 (!) 32  Resp: 18 20 (!) 25 (!) 22  Temp:  98.2 F (36.8 C)    TempSrc:  Oral    SpO2: 92% 95% 95% (!) 86%  Weight:      Height:       PHYSICAL EXAM Pleasant elderly Caucasian lady currently not in distress. . Afebrile. Head is nontraumatic. Neck is supple without bruit.    Cardiac exam no murmur or gallop. Lungs are clear to auscultation. Distal pulses are well felt. Neurological Exam :  Awake alert oriented 2. Dysarthria but can be understood. Follows commands well. Extraocular moments are full range without nystagmus. Pupils irregular but reactive. Fundi not visualized. Mild left lower facial weakness. Tongue  midline. Dense left hemiplegia with 1/5 left upper extremity and 2/5 left lower extremity weakness. Sensation appears diminished on the left compared to the right. Tone is reduced on the left compared to the right. Reflexes absent on the left present on the right. Left plantar upgoing right downgoing. Gait not tested.  NIHSS 7   ASSESSMENT/PLAN Madison Kennedy is a 84 y.o. female with history of AF not on AC, HTN and DB presenting with L facial droop and expressive aphasia. She received tPA 11/12/2017 at 0954.  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 Small vessel disease. Atrophy. ASPECTS 10.     CTA head Severe IC atherosclerosis, greatest in ACAs. Occlusion R A2.  CTA neck no sign stenosis. mod stenosis B VAs.  Diffuse BA dz.   CT perfusion delayed perfusion R medial frontal and parietal lobe. No fixed infarct  MRI  R ACA infarct. Atrophy. Small vessel disease.   2D Echo  pending   LDL 116  HgbA1c pending   SCDs for VTE prophylaxis  Check puncture sites for bleeding or hematomas.  Bleeding precautions  Fall precautions  Diet Carb Modified Fluid consistency: Thin; Room service appropriate? Yes  No antithrombotic prior to admission, now on No antithrombotic as within 24h of tPA administration. Plan aspirin 81 mg 24h post tPA. No need for repeat imaging. Plan AC at time of d/c if remains stable.  Therapy recommendations:  Pending. Ok to be OOB  Disposition:  pending  (lived alone PTA in an elderly apt, fixing own meals)  Atrial Fibrillation  Home anticoagulation:  none   Start asa 81 mg 24h after tPA  Plan AC if remains stable.  Accelerated Hypertension  Elevated on arrival  As high as 184/83 on arrival  BP up to 196/93 during the night. Rechecked 10 mins later and back down. Up d/t cough . Long-term BP goal normotensive  Hyperlipidemia  Home meds:  lipitor 10  Resume Lipitor d in hospital once able to swallow  LDL 116, goal < 70  Diabetes type II  HgbA1c pending, goal < 7.0  CBGs  SSI  Other Stroke Risk Factors  Advanced age  Former Cigarette smoker  UDS / ETOH level negative   Hospital day # 1  Annie Main, MSN, APRN, ANVP-BC, AGPCNP-BC Advanced Practice Stroke Nurse [facility] See Amion for Schedule & Pager information 11/13/2017 10:16 AM  I have personally examined this patient, reviewed notes, independently viewed imaging studies, participated in medical decision making and plan of care.ROS completed by me personally and pertinent positives fully documented  I have made any additions or clarifications directly to the above note. Agree with note above. She has presented with left hemiplegia due to right and is cerebral artery infarct  likely of embolic etiology from atrial fibrillation. She has not been on anticoagulation the past possibly because of fall risk and advanced age. Maintain strict blood pressure control and close neurological monitoring as per post TPA protocol. Long discussion of the bedside with the patient and her daughter and answered questions about her care. Continue ongoing stroke workup. Start aspirin for now and may consider eliquis after about a week if she is stable This patient is critically ill and at significant risk of neurological worsening, death and care requires constant monitoring of vital signs, hemodynamics,respiratory and cardiac monitoring, extensive review of multiple databases, frequent neurological  assessment, discussion with family, other specialists and medical decision making of high complexity.I have made any additions or clarifications directly to the above note.This critical care time does not reflect procedure time, or teaching time or supervisory time of PA/NP/Med Resident etc but could involve care discussion time.  I spent 35 minutes of neurocritical care time  in the care of  this patient.      Delia Heady, MD Medical Director [facility] Pager: [phone] 11/13/2017 4:44 PM  To contact Stroke Continuity provider, please refer to [email or web address]. After hours, contact General Neurology

## 2017-11-13 NOTE — Progress Notes (Signed)
  Echocardiogram 2D Echocardiogram has been performed.  [place] Staffa G Tenesia Escudero 11/13/2017, 12:14 PM

## 2017-11-13 NOTE — Evaluation (Signed)
Physical Therapy Evaluation Patient Details Name: Madison Kennedy MRN: [id number] DOB: 10/31/1919 Today's Date: 11/13/2017   History of Present Illness  pt is a 84 y/o femal with pmh significant for HTN, DM and ?afib, admitted with s/s of stroke.  Determined to have suffered an acute ACA infarct per MRI.  ?Tpa given  Clinical Impression  Pt admitted with/for s/s of R ACA stroke.  Pt needing significant assist at a total level for basic mobility.  Pt currently limited functionally due to the problems listed. ( See problems list.)   Pt will benefit from PT to maximize function and safety in order to get ready for next venue listed below.     Follow Up Recommendations SNF;Supervision/Assistance - 24 hour    Equipment Recommendations  Other (comment)(TBA at next venue)    Recommendations for Other Services       Precautions / Restrictions Precautions Precautions: Fall      Mobility  Bed Mobility Overal bed mobility: Needs Assistance Bed Mobility: Rolling;Sidelying to Sit Rolling: Total assist;+2 for physical assistance Sidelying to sit: Total assist;+2 for physical assistance       General bed mobility comments: pt did not initiate purposefull movements on the R side to help with roll or pushing up from R side.  Pt needed total truncal assist  Transfers Overall transfer level: Needs assistance   Transfers: Sit to/from Stand Sit to Stand: Total assist;+2 safety/equipment         General transfer comment: pt made little effort on the right LE to assist the stand.  Ambulation/Gait                Stairs            Wheelchair Mobility    Modified Rankin (Stroke Patients Only) Modified Rankin (Stroke Patients Only) Pre-Morbid Rankin Score: No symptoms Modified Rankin: Severe disability     Balance Overall balance assessment: Needs assistance Sitting-balance support: Feet supported;Single extremity supported Sitting balance-Leahy Scale: Zero Sitting  balance - Comments: falls to the right and posteriorly the moment assist is released.  pt makes no overt attempt to right herself left toward midline.                                     Pertinent Vitals/Pain Pain Assessment: Faces Faces Pain Scale: No hurt    Home Living Family/patient expects to be discharged to:: Assisted living                 Additional Comments: no family present to determine pt's PLOF.    Prior Function                 Hand Dominance        Extremity/Trunk Assessment   Upper Extremity Assessment Upper Extremity Assessment: Defer to OT evaluation(L UE generally flaccid with no spontaneous movement noted)    Lower Extremity Assessment Lower Extremity Assessment: RLE deficits/detail;LLE deficits/detail RLE Deficits / Details: moves voluntarily against gravity and to resistance  grossly 4/5 RLE Coordination: WNL LLE Deficits / Details: no spontaneous movement or movement to command.  Higher tone with soft contracture of the knee in flexion LLE Sensation: (appears that pt feels light touch on the left.) LLE Coordination: decreased gross motor;decreased fine motor       Communication   Communication: Expressive difficulties  Cognition   Behavior During Therapy: Flat affect Overall Cognitive Status: Difficult to  assess                                 General Comments: pt followed simple commands during MMT      General Comments General comments (skin integrity, edema, etc.): vss overall, but BP in sitting EOB 183/114    Exercises     Assessment/Plan    PT Assessment Patient needs continued PT services  PT Problem List Decreased strength;Decreased activity tolerance;Decreased balance;Decreased mobility;Decreased coordination;Decreased safety awareness;Impaired sensation       PT Treatment Interventions DME instruction;Functional mobility training;Therapeutic activities;Therapeutic exercise;Balance  training;Neuromuscular re-education;Patient/family education    PT Goals (Current goals can be found in the Care Plan section)  Acute Rehab PT Goals Patient Stated Goal: pt unable to relate her wishes PT Goal Formulation: Patient unable to participate in goal setting Time For Goal Achievement: 11/27/17 Potential to Achieve Goals: Fair    Frequency Min 3X/week   Barriers to discharge        Co-evaluation               AM-PAC PT "6 Clicks" Daily Activity  Outcome Measure Difficulty turning over in bed (including adjusting bedclothes, sheets and blankets)?: Unable Difficulty moving from lying on back to sitting on the side of the bed? : Unable Difficulty sitting down on and standing up from a chair with arms (e.g., wheelchair, bedside commode, etc,.)?: Unable Help needed moving to and from a bed to chair (including a wheelchair)?: Total Help needed walking in hospital room?: Total Help needed climbing 3-5 steps with a railing? : Total 6 Click Score: 6    End of Session   Activity Tolerance: Patient tolerated treatment well;Patient limited by fatigue Patient left: in bed;with call bell/phone within reach;with bed alarm set;with nursing/sitter in room Nurse Communication: Mobility status PT Visit Diagnosis: Other abnormalities of gait and mobility (R26.89);Hemiplegia and hemiparesis Hemiplegia - Right/Left: Left Hemiplegia - dominant/non-dominant: Non-dominant Hemiplegia - caused by: Cerebral infarction    Time: 1215-1250 PT Time Calculation (min) (ACUTE ONLY): 35 min   Charges:   PT Evaluation $PT Eval Moderate Complexity: 1 Mod PT Treatments $Therapeutic Activity: 8-22 mins   PT G Codes:        12/04/17  [place] Bing, PT [phone]  (pager)  Eliseo Gum Wania [place]streth December 04, 2017, 1:09 PM

## 2017-11-13 NOTE — Progress Notes (Signed)
OT Cancellation Note  Patient Details Name: Madison Kennedy MRN: [id number] DOB: 03/27/1920   Cancelled Treatment:    Reason Eval/Treat Not Completed: Active bedrest order  Gaye AlkenBailey A Ceasar Decandia M.S., OTR/L Pager: [phone]  11/13/2017, 7:00 AM

## 2017-11-13 NOTE — Plan of Care (Signed)
Chaplain came and prayed with patient and her family this morning.  Patient continues to eat and drink, needs help with set up and food and drink being set up on her right side.  Madison PurpuraSusan Lakshya Mcgillicuddy RN

## 2017-11-13 NOTE — Progress Notes (Signed)
   11/13/17 0900  Clinical Encounter Type  Visited With Patient and family together  Visit Type Spiritual support  Referral From Nurse  Consult/Referral To Chaplain  Spiritual Encounters  Spiritual Needs Prayer  Stress Factors  Patient Stress Factors Health changes  Family Stress Factors Major life changes  Chaplain visited with the PT and family, their initial request was prayer for the PT.  The family was very hopeful and thankful for the prayer.  The family present were the daughter and son in law of the PT.

## 2017-11-13 NOTE — Progress Notes (Signed)
Chaplain visited with PT as she was eating.  Chaplain recently saw her on a different unit earlier in the day.  Chaplain prayed with the PT as she continued to eat.

## 2017-11-14 DIAGNOSIS — I6300 Cerebral infarction due to thrombosis of unspecified precerebral artery: Secondary | ICD-10-CM

## 2017-11-14 LAB — GLUCOSE, CAPILLARY
GLUCOSE-CAPILLARY: 235 mg/dL — AB (ref 65–99)
GLUCOSE-CAPILLARY: 103 mg/dL — AB (ref 65–99)
GLUCOSE-CAPILLARY: 175 mg/dL — AB (ref 65–99)

## 2017-11-14 MED ORDER — PANTOPRAZOLE SODIUM 40 MG PO TBEC
40.0000 mg | DELAYED_RELEASE_TABLET | Freq: Every day | ORAL | Status: DC
  Administered 2017-11-14 – 2017-11-16 (×3): 40 mg via ORAL
  Filled 2017-11-14 (×3): qty 1

## 2017-11-14 NOTE — Evaluation (Signed)
Occupational Therapy Evaluation Patient Details Name: Madison Kennedy MRN: [id number] DOB: 02/06/1920 Today's Date: 11/14/2017    History of Present Illness pt is a 84 y/o female with pmh significant for HTN, DM and ?afib, admitted with s/s of stroke.  Determined to have suffered an acute ACA infarct per MRI.  ?Tpa given   Clinical Impression   Pt admitted with the above diagnoses and presents with below problem list. Pt will benefit from continued acute OT to address the below listed deficits and maximize independence with basic ADLs prior to d/c to next venue. PTA pt was living in ALF. No family present to provide PLOF data. Bedside eval this session. Pt noted to exhibit left side neglect during session. Pt often echoing words of therapist but at times providing 1 word responses: Where are you at: "hospital", "would you like something to drink: "lemonade."      Follow Up Recommendations  SNF    Equipment Recommendations  Other (comment)(defer to next venue)    Recommendations for Other Services       Precautions / Restrictions Precautions Precautions: Fall Restrictions Weight Bearing Restrictions: No      Mobility Bed Mobility                  Transfers                      Balance                                           ADL either performed or assessed with clinical judgement   ADL Overall ADL's : Needs assistance/impaired Eating/Feeding: Sitting;Moderate assistance Eating/Feeding Details (indicate cue type and reason): therapist holding cup and positioning straw for pt to drink Grooming: Total assistance   Upper Body Bathing: Total assistance   Lower Body Bathing: Total assistance   Upper Body Dressing : Total assistance   Lower Body Dressing: Total assistance                 General ADL Comments: Bed level eval. Pt noted to not move head past midline going to right to left. Left side neglect.      Vision  Baseline Vision/History: Wears glasses Additional Comments: difficulty to assess due to impaired cognition and communication.  Did observe her eyes move into left field 1 time with cueing. Did not turn head towards left during session.      Perception Perception Perception Tested?: Yes Perception Deficits: Inattention/neglect Inattention/Neglect: Does not attend to left side of body;Impaired- to be further tested in functional context   Praxis      Pertinent Vitals/Pain Pain Assessment: Faces Faces Pain Scale: No hurt     Hand Dominance Right   Extremity/Trunk Assessment Upper Extremity Assessment Upper Extremity Assessment: RUE deficits/detail;LUE deficits/detail RUE Deficits / Details: generalized weakness LUE Deficits / Details: 1/5 gross strength, mild resistance felt at end points of ROM.  LUE Coordination: decreased fine motor;decreased gross motor   Lower Extremity Assessment Lower Extremity Assessment: Defer to PT evaluation       Communication Communication Communication: Expressive difficulties;Other (comment)(noted to often echo therapist words. 1 word responses.)   Cognition Arousal/Alertness: Awake/alert Behavior During Therapy: Flat affect Overall Cognitive Status: Impaired/Different from baseline Area of Impairment: Orientation;Attention;Memory;Following commands;Awareness;Problem solving  Orientation Level: Time;Situation Current Attention Level: Sustained Memory: Decreased short-term memory;Decreased recall of precautions Following Commands: Follows one step commands inconsistently;Follows one step commands with increased time   Awareness: Intellectual Problem Solving: Slow processing;Requires verbal cues;Requires tactile cues;Difficulty sequencing     General Comments       Exercises     Shoulder Instructions      Home Living Family/patient expects to be discharged to:: Assisted living                                         Prior Functioning/Environment          Comments: no family present to determine pt's PLOF. Per nurse lives in ALF.        OT Problem List: Impaired balance (sitting and/or standing);Decreased knowledge of use of DME or AE;Decreased knowledge of precautions;Decreased range of motion;Decreased strength;Decreased activity tolerance;Impaired vision/perception;Decreased coordination;Decreased cognition;Decreased safety awareness;Impaired tone;Impaired UE functional use;Pain;Cardiopulmonary status limiting activity;Impaired sensation      OT Treatment/Interventions: Self-care/ADL training;Therapeutic exercise;Neuromuscular education;DME and/or AE instruction;Therapeutic activities;Cognitive remediation/compensation;Visual/perceptual remediation/compensation;Patient/family education;Balance training    OT Goals(Current goals can be found in the care plan section) Acute Rehab OT Goals Patient Stated Goal: pt unable to relate her wishes OT Goal Formulation: Patient unable to participate in goal setting Time For Goal Achievement: 11/28/17 Potential to Achieve Goals: Good ADL Goals Pt Will Perform Grooming: with mod assist Pt/caregiver will Perform Home Exercise Program: Left upper extremity;With minimal assist;With written HEP provided Additional ADL Goal #1: Pt will sit EOB with max A for 3 minutes to prepare for EOB ADLs. Additional ADL Goal #2: Caregiver will be indenpendent with strategies for left side neglect of pt.  OT Frequency: Min 2X/week   Barriers to D/C:            Co-evaluation              AM-PAC PT "6 Clicks" Daily Activity     Outcome Measure Help from another person eating meals?: A Lot Help from another person taking care of personal grooming?: Total Help from another person toileting, which includes using toliet, bedpan, or urinal?: Total Help from another person bathing (including washing, rinsing, drying)?: Total Help from another person  to put on and taking off regular upper body clothing?: Total Help from another person to put on and taking off regular lower body clothing?: Total 6 Click Score: 7   End of Session    Activity Tolerance: Patient tolerated treatment well Patient left: in bed;with call bell/phone within reach;with bed alarm set  OT Visit Diagnosis: Unsteadiness on feet (R26.81);Other abnormalities of gait and mobility (R26.89);Muscle weakness (generalized) (M62.81);Other symptoms and signs involving the nervous system (R29.898);Other symptoms and signs involving cognitive function;Cognitive communication deficit (R41.841);Hemiplegia and hemiparesis;Pain Hemiplegia - Right/Left: Left                Time: 4098-1191 OT Time Calculation (min): 20 min Charges:  OT General Charges $OT Visit: 1 Visit OT Evaluation $OT Eval Low Complexity: 1 Low G-Codes:       Pilar Grammes 11/14/2017, 12:06 PM

## 2017-11-14 NOTE — Progress Notes (Signed)
PHARMACIST - PHYSICIAN COMMUNICATION  CONCERNING: IV to Oral Route Change Policy  RECOMMENDATION: This patient is receiving pantoprazole by the intravenous route.  Based on criteria approved by the Pharmacy and Therapeutics Committee, the intravenous medication(s) is/are being converted to the equivalent oral dose form(s).  DESCRIPTION: These criteria include:  The patient is eating (either orally or via tube) and/or has been taking other orally administered medications for a least 24 hours  The patient has no evidence of active gastrointestinal bleeding or impaired GI absorption (gastrectomy, short bowel, patient on TNA or NPO).  If you have questions about this conversion, please contact the Pharmacy Department  []   [phone]( [phone] )  [facility] []   [phone]( [phone] )  [facility] [x]   [phone]( [phone] )  [facility] []   [phone]( [phone]  [facility] []   [phone]( [phone] )  [facility]   HendrumErin N. Zigmund Danieleja, PharmD PGY1 Pharmacy Resident Pager: [phone] 11/14/2017 1:29 PM

## 2017-11-14 NOTE — Progress Notes (Signed)
STROKE TEAM PROGRESS NOTE      INTERVAL HISTORY Her daughter and son in law are at the bedside.  Patient lying comfortably in bed, blood pressure is adequately controlled. She still has persistent dense left hemiplegia without any improvement. Her LDL and A1c are both slightly elevated CBC:  CBC Latest Ref Rng & Units 11/13/2017 11/13/2017 11/12/2017  WBC 4.0 - 10.5 K/uL 8.1 SPECIMEN CLOTTED -  Hemoglobin 12.0 - 15.0 g/dL 8.2(N9.7(L) SPECIMEN CLOTTED 11.6(L)  Hematocrit 36.0 - 46.0 % 30.7(L) SPECIMEN CLOTTED 34.0(L)  Platelets 150 - 400 K/uL 215 SPECIMEN CLOTTED -     Comprehensive Metabolic Panel:   CMP Latest Ref Rng & Units 11/12/2017 11/12/2017  Glucose 65 - 99 mg/dL 90 92  BUN 6 - 20 mg/dL 56(O37(H) 13(Y40(H)  Creatinine 0.44 - 1.00 mg/dL 8.65(H1.60(H) 8.46(N1.74(H)  Sodium 135 - 145 mmol/L 141 142  Potassium 3.5 - 5.1 mmol/L 4.0 4.0  Chloride 101 - 111 mmol/L 107 107  CO2 22 - 32 mmol/L - 23  Calcium 8.9 - 10.3 mg/dL - 9.1  Total Protein 6.5 - 8.1 g/dL - 6.0(L)  Total Bilirubin 0.3 - 1.2 mg/dL - 0.7  Alkaline Phos 38 - 126 U/L - 86  AST 15 - 41 U/L - 24  ALT 14 - 54 U/L - 15   Lipid Panel:     Component Value Date/Time   CHOL 192 11/13/2017 0443   TRIG 199 (H) 11/13/2017 0443   TRIG 189 (H) 11/13/2017 0443   HDL 36 (L) 11/13/2017 0443   CHOLHDL 5.3 11/13/2017 0443   VLDL 40 11/13/2017 0443   LDLCALC 116 (H) 11/13/2017 0443   HgbA1c:  Lab Results  Component Value Date   HGBA1C 7.3 (H) 11/13/2017   Urine Drug Screen:     Component Value Date/Time   LABOPIA NONE DETECTED 11/12/2017 1646   COCAINSCRNUR NONE DETECTED 11/12/2017 1646   LABBENZ NONE DETECTED 11/12/2017 1646   AMPHETMU NONE DETECTED 11/12/2017 1646   THCU NONE DETECTED 11/12/2017 1646   LABBARB NONE DETECTED 11/12/2017 1646    Alcohol Level     Component Value Date/Time   ETH <10 11/12/2017 0918    Vitals:   11/13/17 1200 11/13/17 1409 11/14/17 0000 11/14/17 0922  BP: (!) 166/75 (!) 162/69 (!) 171/78 (!) 168/75  Pulse: 85  83 84 82  Resp: 20 17 16 16   Temp: 98.7 F (37.1 C) 99.1 F (37.3 C) 98.4 F (36.9 C) 98.6 F (37 C)  TempSrc: Oral Oral Oral Rectal  SpO2: 98% 98% 97% 98%  Weight:      Height:       PHYSICAL EXAM Pleasant elderly Caucasian lady currently not in distress. . Afebrile. Head is nontraumatic. Neck is supple without bruit.    Cardiac exam no murmur or gallop. Lungs are clear to auscultation. Distal pulses are well felt. Neurological Exam :  Awake alert oriented 2. Mild dysarthria but can be understood. Follows commands well. Extraocular moments are full range without nystagmus. Pupils irregular but reactive. Fundi not visualized. Mild left lower facial weakness. Tongue midline. Dense left hemiplegia with 1/5 left upper extremity and 1/5 left lower extremity weakness. Sensation appears diminished on the left compared to the right. Tone is reduced on the left compared to the right. Reflexes absent on the left present on the right. Left plantar upgoing right downgoing. Gait not tested.      ASSESSMENT/PLAN Madison Kennedy is a 84 y.o. female with history of AF not on  AC, HTN and DB presenting with L facial droop and expressive aphasia. She received tPA 11/12/2017 at 0954.  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 Small vessel disease. Atrophy. ASPECTS 10.     CTA head Severe IC atherosclerosis, greatest in ACAs. Occlusion R A2.  CTA neck no sign stenosis. mod stenosis B VAs. Diffuse BA dz.   CT perfusion delayed perfusion R medial frontal and parietal lobe. No fixed infarct  MRI  R ACA infarct. Atrophy. Small vessel disease.   2D Echo  pending   LDL 116  HgbA1c 7.2SCDs for VTE prophylaxis Fall precautions Diet Carb Modified Fluid consistency: Thin; Room service appropriate? Yes  No antithrombotic prior to admission, now on No antithrombotic as within 24h of tPA  administration. Plan aspirin 81 mg 24h post tPA. No need for repeat imaging. Plan AC at time of d/c if remains stable.  Therapy recommendations:  Pending. Ok to be OOB  Disposition:  pending  (lived alone PTA in an elderly apt, fixing own meals)  Atrial Fibrillation  Home anticoagulation:  none   Start asa 81 mg 24h after tPA  Plan AC if remains stable.  Accelerated Hypertension  Elevated on arrival  As high as 184/83 on arrival  BP up to 196/93 during the night. Rechecked 10 mins later and back down. Up d/t cough . Long-term BP goal normotensive  Hyperlipidemia  Home meds:  lipitor 10  Resume Lipitor d in hospital once able to swallow  LDL 116, goal < 70  Diabetes type II  HgbA1c 7.3 goal < 7.0  CBGs  SSI  Other Stroke Risk Factors  Advanced age  Former Cigarette smoker  UDS / ETOH level negative   Hospital day # 2    She has presented with left hemiplegia due to right and is cerebral artery infarct likely of embolic etiology from atrial fibrillation. She has not been on anticoagulation the past possibly because of fall risk and advanced age.  Mobile a lot of bed. Therapy consults. Likely transfer to rehabilitation facility next week. Long discussion of the bedside with the patient and her daughter and answered questions about her care. Continue aspirin for now and may consider eliquis after about a week if she is stable        Delia Heady, MD Medical Director [facility] Pager: [phone] 11/14/2017 10:28 AM  To contact Stroke Continuity provider, please refer to [email or web address]. After hours, contact General Neurology

## 2017-11-15 ENCOUNTER — Inpatient Hospital Stay (HOSPITAL_COMMUNITY): Payer: Medicare Other

## 2017-11-15 LAB — URINALYSIS, COMPLETE (UACMP) WITH MICROSCOPIC
Bilirubin Urine: NEGATIVE
Glucose, UA: NEGATIVE mg/dL
Ketones, ur: NEGATIVE mg/dL
Nitrite: POSITIVE — AB
PROTEIN: 100 mg/dL — AB
Specific Gravity, Urine: 1.025 (ref 1.005–1.030)
pH: 5 (ref 5.0–8.0)

## 2017-11-15 LAB — GLUCOSE, CAPILLARY
GLUCOSE-CAPILLARY: 166 mg/dL — AB (ref 65–99)
GLUCOSE-CAPILLARY: 222 mg/dL — AB (ref 65–99)
GLUCOSE-CAPILLARY: 148 mg/dL — AB (ref 65–99)
Glucose-Capillary: 172 mg/dL — ABNORMAL HIGH (ref 65–99)

## 2017-11-15 MED ORDER — ENOXAPARIN SODIUM 30 MG/0.3ML ~~LOC~~ SOLN
30.0000 mg | SUBCUTANEOUS | Status: DC
  Administered 2017-11-15 – 2017-11-17 (×3): 30 mg via SUBCUTANEOUS
  Filled 2017-11-15 (×3): qty 0.3

## 2017-11-15 MED ORDER — METOPROLOL TARTRATE 12.5 MG HALF TABLET
12.5000 mg | ORAL_TABLET | Freq: Two times a day (BID) | ORAL | Status: DC
  Administered 2017-11-15: 12.5 mg via ORAL
  Filled 2017-11-15 (×2): qty 1

## 2017-11-15 MED ORDER — SODIUM CHLORIDE 0.9 % IV SOLN
1.0000 g | INTRAVENOUS | Status: DC
  Administered 2017-11-15 – 2017-11-16 (×2): 1 g via INTRAVENOUS
  Filled 2017-11-15 (×3): qty 10

## 2017-11-15 MED ORDER — SODIUM CHLORIDE 0.9 % IV SOLN
INTRAVENOUS | Status: DC
  Administered 2017-11-15 – 2017-11-16 (×2): via INTRAVENOUS

## 2017-11-15 NOTE — Progress Notes (Signed)
STROKE TEAM PROGRESS NOTE      INTERVAL HISTORY No family members present today.  The patient is somewhat lethargic and slow to eat her breakfast.  Dr. Pearlean BrownieSethi spoke with the patient's nurse to arrange for assistance with meals.   CBC:  CBC Latest Ref Rng & Units 11/13/2017 11/13/2017 11/12/2017  WBC 4.0 - 10.5 K/uL 8.1 SPECIMEN CLOTTED -  Hemoglobin 12.0 - 15.0 g/dL 4.0(J9.7(L) SPECIMEN CLOTTED 11.6(L)  Hematocrit 36.0 - 46.0 % 30.7(L) SPECIMEN CLOTTED 34.0(L)  Platelets 150 - 400 K/uL 215 SPECIMEN CLOTTED -     Comprehensive Metabolic Panel:   CMP Latest Ref Rng & Units 11/12/2017 11/12/2017  Glucose 65 - 99 mg/dL 90 92  BUN 6 - 20 mg/dL 81(X37(H) 91(Y40(H)  Creatinine 0.44 - 1.00 mg/dL 7.82(N1.60(H) 5.62(Z1.74(H)  Sodium 135 - 145 mmol/L 141 142  Potassium 3.5 - 5.1 mmol/L 4.0 4.0  Chloride 101 - 111 mmol/L 107 107  CO2 22 - 32 mmol/L - 23  Calcium 8.9 - 10.3 mg/dL - 9.1  Total Protein 6.5 - 8.1 g/dL - 6.0(L)  Total Bilirubin 0.3 - 1.2 mg/dL - 0.7  Alkaline Phos 38 - 126 U/L - 86  AST 15 - 41 U/L - 24  ALT 14 - 54 U/L - 15   Lipid Panel:     Component Value Date/Time   CHOL 192 11/13/2017 0443   TRIG 199 (H) 11/13/2017 0443   TRIG 189 (H) 11/13/2017 0443   HDL 36 (L) 11/13/2017 0443   CHOLHDL 5.3 11/13/2017 0443   VLDL 40 11/13/2017 0443   LDLCALC 116 (H) 11/13/2017 0443   HgbA1c:  Lab Results  Component Value Date   HGBA1C 7.3 (H) 11/13/2017   Urine Drug Screen:     Component Value Date/Time   LABOPIA NONE DETECTED 11/12/2017 1646   COCAINSCRNUR NONE DETECTED 11/12/2017 1646   LABBENZ NONE DETECTED 11/12/2017 1646   AMPHETMU NONE DETECTED 11/12/2017 1646   THCU NONE DETECTED 11/12/2017 1646   LABBARB NONE DETECTED 11/12/2017 1646    Alcohol Level     Component Value Date/Time   ETH <10 11/12/2017 0918    Vitals:   11/14/17 2011 11/14/17 2339 11/15/17 0332 11/15/17 0820  BP: (!) 169/66 (!) 141/63 (!) 153/64 (!) 169/77  Pulse: 82 79 85 83  Resp: 16 16 16 16   Temp: 99.4 F (37.4 C)  99.3 F (37.4 C) 99.3 F (37.4 C) 99.1 F (37.3 C)  TempSrc: Oral Oral Oral Axillary  SpO2: 99% 100% 96% 96%  Weight:      Height:       PHYSICAL EXAM Pleasant elderly Caucasian lady currently not in distress. . Afebrile. Head is nontraumatic. Neck is supple without bruit.    Cardiac exam no murmur or gallop. Lungs are clear to auscultation. Distal pulses are well felt. Neurological Exam :  Awake alert oriented 2. Mild dysarthria but can be understood. Follows commands well. Extraocular moments are full range without nystagmus. Pupils irregular but reactive. Fundi not visualized. Mild left lower facial weakness. Tongue midline. Dense left hemiplegia with 1/5 left upper extremity and 1/5 left lower extremity weakness. Sensation appears diminished on the left compared to the right. Tone is reduced on the left compared to the right. Reflexes absent on the left present on the right. Left plantar upgoing right downgoing. Gait not tested.      ASSESSMENT/PLAN Madison Kennedy is a 84 y.o. female with history of AF not on AC, HTN and Diabetes presenting with  L facial droop and expressive aphasia. She received tPA 11/12/2017 at 0954.  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 Small vessel disease. Atrophy. ASPECTS 10.     CTA head Severe IC atherosclerosis, greatest in ACAs. Occlusion R A2.  CTA neck no sign stenosis. mod stenosis B VAs. Diffuse BA dz.   CT perfusion delayed perfusion R medial frontal and parietal lobe. No fixed infarct  MRI  R ACA infarct. Atrophy. Small vessel disease.   2D Echo - EF 35 - 40 %.  No cardiac source of emboli identified.  LDL 116  HgbA1c 7.2  SCDs for VTE prophylaxis Fall precautions Diet Carb Modified Fluid consistency: Thin; Room service appropriate? Yes  No antithrombotic prior to admission, now on Aspirin 81 mg daily   No need for  repeat imaging. Plan AC at time of d/c if remains stable.  Therapy recommendations:  Pending. Ok to be OOB  Disposition:  pending  (lived alone PTA in an elderly apt, fixing own meals)  Atrial Fibrillation  Home anticoagulation:  none   Start asa 81 mg 24h after tPA  Plan AC if remains stable.  Accelerated Hypertension  Elevated on arrival  As high as 184/83 on arrival  BP up to 196/93 during the night. Rechecked 10 mins later and back down. Up d/t cough . Long-term BP goal normotensive  Hyperlipidemia  Home meds:  lipitor 10  Resume Lipitor d in hospital once able to swallow  LDL 116, goal < 70  Diabetes type II  HgbA1c 7.3 goal < 7.0  CBGs  SSI  Other Stroke Risk Factors  Advanced age  Former Cigarette smoker  UDS / ETOH levels - normal   Plan / Recommendations   Stroke workup: completed  Therapy Follow Up: pending  Disposition: SNF  Antiplatelet / Anticoagulation: ASA 81 mg daily currently.  Considering anticoagulation for atrial fibrillation.  Statin: Lipitor 10 mg daily  MD Follow Up: pending  Other: pending  Further risk factor modification per primary care MD: Follow Up 2 weeks  BUN and creatinine creeping up -gently hydrate -EF 35-40% - Bmet in AM x 3 days  Add Lovenox as DVT prophylaxis.  Anemia - repeat CBC in a.m. X 2 days  Increase activity  Hospital day # 3    She has presented with left hemiplegia due to right and is cerebral artery infarct likely of embolic etiology from atrial fibrillation. She has not been on anticoagulation the past possibly because of fall risk and advanced age.  Mobile a lot of bed. Therapy consults. Likely transfer to SNF rehabilitation facility next week. Long discussion of the bedside with the patient and her daughter and answered questions about her care. Continue aspirin for now and may consider eliquis after about a week if she is stable   Delia Heady, MD Medical Director [facility] Pager: [phone] 11/15/2017 11:28 AM      To contact Stroke Continuity provider, please refer to [email or web address]. After hours, contact General Neurology

## 2017-11-15 NOTE — ED Provider Notes (Signed)
[facility] Provider Note   CSN: [id number] Arrival date & time: 11/12/17  0915     History   Chief Complaint Chief Complaint  Patient presents with  . Code Stroke    HPI Madison Kennedy is a 84 y.o. female.  HPI   84 year old female presenting as a code stroke.  Last known normal at 8 AM.  Yesterday she was feeling generally ill sounds problems with constipation.  This morning she woke up and fell.  For the weakness on the left side and some confusion. Past Medical History:  Diagnosis Date  . Diabetes mellitus without complication (HCC)   . Hypertension     Patient Active Problem List   Diagnosis Date Noted  . 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 Chronic anemia 04/30/2017  . Cataract cortical, senile 04/06/2017  . Benign essential hypertension 04/06/2017  . GERD (gastroesophageal reflux disease) 04/06/2017  . PAC (premature atrial contraction) 04/06/2017  . Degenerative lumbar spinal stenosis 09/19/2015  . Lumbar spondylosis 09/19/2015  . Primary osteoarthritis of right hip 09/19/2015  . Diabetes mellitus type 2, controlled, without complications (HCC) 11/02/2014  . Hyperlipidemia, mixed 11/02/2014  . Chronic hoarseness 05/03/2014  . Idiopathic peripheral neuropathy 05/03/2014    History reviewed. No pertinent surgical history.   OB History   None      Home Medications    Prior to Admission medications   Medication Sig Start Date End Date Taking? Authorizing Provider  acetaminophen (TYLENOL) 500 MG tablet Take by mouth.   Yes [provider]  Alogliptin Benzoate 12.5 MG TABS Take by mouth. 05/31/16  Yes [provider]  amoxicillin-clavulanate (AUGMENTIN) 500-125 MG tablet Take 1 tablet by mouth 2 (two) times daily. 11/11/17 11/18/17 Yes [provider]  atorvastatin (LIPITOR) 10 MG tablet Take by mouth. 01/02/16  Yes [provider]  bisacodyl (DULCOLAX) 10 MG suppository Place 10 mg rectally daily as  needed for mild constipation.  11/11/17 11/21/17 Yes [provider]  diltiazem (TIAZAC) 180 MG 24 hr capsule take 1 capsule by mouth once daily 03/16/17  Yes [provider]  esomeprazole (NEXIUM) 40 MG capsule Take by mouth. 08/13/16  Yes [provider]  glimepiride (AMARYL) 4 MG tablet Take 6 mg by mouth See admin instructions. Taking 1 tablet in the AM and 1/2 tablet in the afternoon 07/15/16 11/12/17 Yes [provider]  glipiZIDE (GLUCOTROL) 5 MG tablet Take by mouth daily as needed (if BS is over 300).    Yes [provider]  Iron-Vitamin C (VITRON-C) 65-125 MG TABS Take by mouth.   Yes [provider]  Multiple Vitamins-Minerals (ICAPS PO) Take by mouth.   Yes [provider]  NONFORMULARY OR COMPOUNDED ITEM Apply 1-2 g topically 4 (four) times daily. 09/23/16  Yes Felecia Shelling, DPM  sertraline (ZOLOFT) 50 MG tablet Take by mouth. 04/30/17 04/30/18 Yes [provider]  traMADol (ULTRAM) 50 MG tablet take 1 tablet by mouth twice a day if needed 07/31/16  Yes [provider]  FLUZONE HIGH-DOSE 0.5 ML injection inject 0.5 milliliter intramuscularly 07/03/17   [provider]  PREVNAR 13 SUSP injection inject 0.5 milliliter intramuscularly 07/03/17   [provider]    Family History No family history on file.  Social History Social History   Tobacco Use  . Smoking status: Former Games developer  . Smokeless tobacco: Never Used  Substance Use Topics  . Alcohol use: Not on file    Comment: occasional drink  .  Drug use: No     Allergies   Ace inhibitors and Tramadol   Review of Systems Review of Systems  All systems reviewed and negative, other than as noted in HPI.  Physical Exam Updated Vital Signs BP (!) 162/70 (BP Location: Right Arm)   Pulse 85   Temp 98.5 F (36.9 C) (Oral)   Resp 18   Ht 5\' 5"  (1.651 m)   Wt 60 kg (132 lb 4.4 oz)   SpO2 96%   BMI 22.01 kg/m   Physical  Exam  General:  Laying on stretcher.  No acute distress. Eyes: Pupils are equal round reactive to light. HEENT: Head is normocephalic and atraumatic. Respiratory: Lungs are clear bilaterally. Cardiac: Heart sounds regular. Neurologic: Response to questioning although answers are nonsensical.  Identified left upper extremity.  2 out of 5 left lower extremity.  5 out of 5 right upper and lower extremity. Skin: No concerning lesions noted. ED Treatments / Results  Labs (all labs ordered are listed, but only abnormal results are displayed) Labs Reviewed  CBC - Abnormal; Notable for the following components:      Result Value   WBC 13.0 (*)    RBC 3.55 (*)    Hemoglobin 11.1 (*)    HCT 33.3 (*)    RDW 16.3 (*)    All other components within normal limits  DIFFERENTIAL - Abnormal; Notable for the following components:   Neutro Abs 10.2 (*)    All other components within normal limits  COMPREHENSIVE METABOLIC PANEL - Abnormal; Notable for the following components:   BUN 40 (*)    Creatinine, Ser 1.74 (*)    Total Protein 6.0 (*)    Albumin 3.2 (*)    GFR calc non Af Amer 24 (*)    GFR calc Af Amer 28 (*)    All other components within normal limits  URINALYSIS, ROUTINE W REFLEX MICROSCOPIC - Abnormal; Notable for the following components:   Specific Gravity, Urine 1.041 (*)    Protein, ur 30 (*)    Squamous Epithelial / LPF 0-5 (*)    All other components within normal limits  GLUCOSE, CAPILLARY - Abnormal; Notable for the following components:   Glucose-Capillary 170 (*)    All other components within normal limits  LIPID PANEL - Abnormal; Notable for the following components:   Triglycerides 199 (*)    HDL 36 (*)    LDL Cholesterol 116 (*)    All other components within normal limits  GLUCOSE, CAPILLARY - Abnormal; Notable for the following components:   Glucose-Capillary 100 (*)    All other components within normal limits  TRIGLYCERIDES - Abnormal; Notable for the  following components:   Triglycerides 189 (*)    All other components within normal limits  GLUCOSE, CAPILLARY - Abnormal; Notable for the following components:   Glucose-Capillary 110 (*)    All other components within normal limits  GLUCOSE, CAPILLARY - Abnormal; Notable for the following components:   Glucose-Capillary 167 (*)    All other components within normal limits  CBC - Abnormal; Notable for the following components:   RBC 3.26 (*)    Hemoglobin 9.7 (*)    HCT 30.7 (*)    RDW 15.9 (*)    All other components within normal limits  HEMOGLOBIN A1C - Abnormal; Notable for the following components:   Hgb A1c MFr Bld 7.3 (*)    All other components within normal limits  GLUCOSE, CAPILLARY - Abnormal; Notable for  the following components:   Glucose-Capillary 191 (*)    All other components within normal limits  GLUCOSE, CAPILLARY - Abnormal; Notable for the following components:   Glucose-Capillary 162 (*)    All other components within normal limits  GLUCOSE, CAPILLARY - Abnormal; Notable for the following components:   Glucose-Capillary 235 (*)    All other components within normal limits  GLUCOSE, CAPILLARY - Abnormal; Notable for the following components:   Glucose-Capillary 103 (*)    All other components within normal limits  GLUCOSE, CAPILLARY - Abnormal; Notable for the following components:   Glucose-Capillary 175 (*)    All other components within normal limits  GLUCOSE, CAPILLARY - Abnormal; Notable for the following components:   Glucose-Capillary 166 (*)    All other components within normal limits  GLUCOSE, CAPILLARY - Abnormal; Notable for the following components:   Glucose-Capillary 222 (*)    All other components within normal limits  URINALYSIS, COMPLETE (UACMP) WITH MICROSCOPIC - Abnormal; Notable for the following components:   APPearance HAZY (*)    Hgb urine dipstick SMALL (*)    Protein, ur 100 (*)    Nitrite POSITIVE (*)    Leukocytes, UA LARGE  (*)    Bacteria, UA RARE (*)    Squamous Epithelial / LPF 0-5 (*)    All other components within normal limits  I-STAT CHEM 8, ED - Abnormal; Notable for the following components:   BUN 37 (*)    Creatinine, Ser 1.60 (*)    Calcium, Ion 1.12 (*)    Hemoglobin 11.6 (*)    HCT 34.0 (*)    All other components within normal limits  MRSA PCR SCREENING  ETHANOL  PROTIME-INR  APTT  RAPID URINE DRUG SCREEN, HOSP PERFORMED  GLUCOSE, CAPILLARY  CBC  I-STAT TROPONIN, ED  CBG MONITORING, ED    EKG EKG Interpretation  Date/Time:  Thursday November 12 2017 10:10:31 EDT Ventricular Rate:  77 PR Interval:    QRS Duration: 92 QT Interval:  420 QTC Calculation: 476 R Axis:   63 Text Interpretation:  Normal sinus rhythm LVH with secondary repolarization abnormality Anterior infarct, old poor baseline Confirmed by Jacalyn Lefevre [phone]) on 11/13/2017 11:54:10 AM   Radiology Dg Chest Port 1 View  Result Date: 11/15/2017 CLINICAL DATA:  Fever. EXAM: PORTABLE CHEST 1 VIEW COMPARISON:  None. FINDINGS: Mild patchy opacity in the right base. No pneumothorax. No nodules or masses. No other focal infiltrates. Cardiomegaly. The hila and mediastinum are normal. IMPRESSION: Patchy opacity in the right base is worrisome for mild pneumonia given history. Recommend follow-up to resolution. Electronically Signed   By: Gerome Sam III M.D   On: 11/15/2017 15:39    Ct Angio Head W Or Wo Contrast  Result Date: 11/12/2017 CLINICAL DATA:  Focal neuro deficit, left-sided weakness EXAM: CT ANGIOGRAPHY HEAD AND NECK CT PERFUSION BRAIN TECHNIQUE: Multidetector CT imaging of the head and neck was performed using the standard protocol during bolus administration of intravenous contrast. Multiplanar CT image reconstructions and MIPs were obtained to evaluate the vascular anatomy. Carotid stenosis measurements (when applicable) are obtained utilizing NASCET criteria, using the distal internal carotid diameter as the  denominator. Multiphase CT imaging of the brain was performed following IV bolus contrast injection. Subsequent parametric perfusion maps were calculated using RAPID software. CONTRAST:  90mL ISOVUE-370 IOPAMIDOL (ISOVUE-370) INJECTION 76% COMPARISON:  CT head 11/12/2017 FINDINGS: CTA NECK FINDINGS Aortic arch: Atherosclerotic calcification aortic arch. Negative for aneurysm or dissection. Proximal great vessels widely  patent Right carotid system: Mild atherosclerotic disease at the right carotid bifurcation without stenosis. Left carotid system: Mild atherosclerotic calcification left carotid bifurcation without stenosis Vertebral arteries: Both vertebral arteries patent to the basilar. Moderate stenosis distal vertebral artery bilaterally due to atherosclerotic disease. Skeleton: Cervical spondylosis and degenerative change. No acute skeletal abnormality. Other neck: Negative for mass or edema.  No adenopathy. Upper chest: Lung apices are clear. Mild apical scarring bilaterally. Review of the MIP images confirms the above findings CTA HEAD FINDINGS Anterior circulation: Atherosclerotic calcification in the cavernous carotid bilaterally without stenosis. A1 segment patent bilaterally. Occlusion of the right A2 segment. Severe disease in the left A3 branches. Middle cerebral artery patent bilaterally without occlusion. Moderate atherosclerotic disease in the middle cerebral artery branches bilaterally Posterior circulation: Moderate stenosis distal vertebral bilaterally. Basilar is diffusely diseased with multiple areas of moderate stenosis. PICA patent bilaterally. AICA patent bilaterally. 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 Mild disease in the posterior cerebral arteries bilaterally. Venous sinuses: Negative Anatomic variants: Negative for aneurysm. Delayed phase: Not perform Review of the MIP images confirms the above findings CT Brain Perfusion  Findings: CBF (<30%) Volume: 0mL Perfusion (Tmax>6.0s) volume: 5mL. T-max greater than 4 seconds equal 17 mL. Mismatch Volume: 5mL Infarction Location:Right medial parietal lobe IMPRESSION: No significant carotid or vertebral artery stenosis in the neck. Moderate stenosis distal vertebral artery bilaterally. Diffuse disease in the basilar Severe intracranial atherosclerotic disease most notably in the anterior cerebral arteries bilaterally. Occlusion of the right A2 segment and diffuse disease in the left anterior cerebral artery. Middle cerebral artery branches are diffusely diseased. CT perfusion demonstrates mild delayed perfusion right medial frontal and parietal lobe, possibly anterior cerebral artery distribution. Right A2 occlusion could be acute based on this finding. No fixed infarction. Electronically Signed   By: Marlan Palau M.D.   On: 11/12/2017 10:28   Ct Angio Neck W Or Wo Contrast  Result Date: 11/12/2017 CLINICAL DATA:  Focal neuro deficit, left-sided weakness EXAM: CT ANGIOGRAPHY HEAD AND NECK CT PERFUSION BRAIN TECHNIQUE: Multidetector CT imaging of the head and neck was performed using the standard protocol during bolus administration of intravenous contrast. Multiplanar CT image reconstructions and MIPs were obtained to evaluate the vascular anatomy. Carotid stenosis measurements (when applicable) are obtained utilizing NASCET criteria, using the distal internal carotid diameter as the denominator. Multiphase CT imaging of the brain was performed following IV bolus contrast injection. Subsequent parametric perfusion maps were calculated using RAPID software. CONTRAST:  90mL ISOVUE-370 IOPAMIDOL (ISOVUE-370) INJECTION 76% COMPARISON:  CT head 11/12/2017 FINDINGS: CTA NECK FINDINGS Aortic arch: Atherosclerotic calcification aortic arch. Negative for aneurysm or dissection. Proximal great vessels widely patent Right carotid system: Mild atherosclerotic disease at the right carotid bifurcation  without stenosis. Left carotid system: Mild atherosclerotic calcification left carotid bifurcation without stenosis Vertebral arteries: Both vertebral arteries patent to the basilar. Moderate stenosis distal vertebral artery bilaterally due to atherosclerotic disease. Skeleton: Cervical spondylosis and degenerative change. No acute skeletal abnormality. Other neck: Negative for mass or edema.  No adenopathy. Upper chest: Lung apices are clear. Mild apical scarring bilaterally. Review of the MIP images confirms the above findings CTA HEAD FINDINGS Anterior circulation: Atherosclerotic calcification in the cavernous carotid bilaterally without stenosis. A1 segment patent bilaterally. Occlusion of the right A2 segment. Severe disease in the left A3 branches. Middle cerebral artery patent bilaterally without occlusion. Moderate atherosclerotic disease in the middle cerebral artery branches bilaterally Posterior circulation: Moderate stenosis distal  vertebral bilaterally. Basilar is diffusely diseased with multiple areas of moderate stenosis. PICA patent bilaterally. AICA patent bilaterally. 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 Mild disease in the posterior cerebral arteries bilaterally. Venous sinuses: Negative Anatomic variants: Negative for aneurysm. Delayed phase: Not perform Review of the MIP images confirms the above findings CT Brain Perfusion Findings: CBF (<30%) Volume: 0mL Perfusion (Tmax>6.0s) volume: 5mL. T-max greater than 4 seconds equal 17 mL. Mismatch Volume: 5mL Infarction Location:Right medial parietal lobe IMPRESSION: No significant carotid or vertebral artery stenosis in the neck. Moderate stenosis distal vertebral artery bilaterally. Diffuse disease in the basilar Severe intracranial atherosclerotic disease most notably in the anterior cerebral arteries bilaterally. Occlusion of the right A2 segment and diffuse disease in the left  anterior cerebral artery. Middle cerebral artery branches are diffusely diseased. CT perfusion demonstrates mild delayed perfusion right medial frontal and parietal lobe, possibly anterior cerebral artery distribution. Right A2 occlusion could be acute based on this finding. No fixed infarction. Electronically Signed   By: Marlan Palau M.D.   On: 11/12/2017 10:28   Mr Brain Wo Contrast  Result Date: 11/13/2017 CLINICAL DATA:  Left-sided weakness and facial droop EXAM: MRI HEAD WITHOUT CONTRAST TECHNIQUE: Multiplanar, multiecho pulse sequences of the brain and surrounding structures were obtained without intravenous contrast. COMPARISON:  Head CT 11/12/2017 FINDINGS: BRAIN: The midline structures are normal. There is abnormal diffusion restriction within the right anterior cerebral artery territory. No mass lesion, hydrocephalus, dural abnormality or extra-axial collection. Multifocal white matter hyperintensity, most commonly due to chronic ischemic microangiopathy. Generalized atrophy without lobar predilection. Old right basal ganglia lacunar infarct. No chronic microhemorrhage or superficial siderosis. VASCULAR: Major intracranial arterial and venous sinus flow voids are preserved. SKULL AND UPPER CERVICAL SPINE: The visualized skull base, calvarium, upper cervical spine and extracranial soft tissues are normal. SINUSES/ORBITS: No fluid levels or advanced mucosal thickening. No mastoid or middle ear effusion. Normal orbits. IMPRESSION: 1. Nonhemorrhagic acute infarct of the right anterior cerebral artery territory. No mass effect. 2. Generalized atrophy and sequelae of chronic ischemic microangiopathy. Electronically Signed   By: Deatra Robinson M.D.   On: 11/13/2017 04:18   Ct Cerebral Perfusion W Contrast  Result Date: 11/12/2017 CLINICAL DATA:  Focal neuro deficit, left-sided weakness EXAM: CT ANGIOGRAPHY HEAD AND NECK CT PERFUSION BRAIN TECHNIQUE: Multidetector CT imaging of the head and neck was  performed using the standard protocol during bolus administration of intravenous contrast. Multiplanar CT image reconstructions and MIPs were obtained to evaluate the vascular anatomy. Carotid stenosis measurements (when applicable) are obtained utilizing NASCET criteria, using the distal internal carotid diameter as the denominator. Multiphase CT imaging of the brain was performed following IV bolus contrast injection. Subsequent parametric perfusion maps were calculated using RAPID software. CONTRAST:  90mL ISOVUE-370 IOPAMIDOL (ISOVUE-370) INJECTION 76% COMPARISON:  CT head 11/12/2017 FINDINGS: CTA NECK FINDINGS Aortic arch: Atherosclerotic calcification aortic arch. Negative for aneurysm or dissection. Proximal great vessels widely patent Right carotid system: Mild atherosclerotic disease at the right carotid bifurcation without stenosis. Left carotid system: Mild atherosclerotic calcification left carotid bifurcation without stenosis Vertebral arteries: Both vertebral arteries patent to the basilar. Moderate stenosis distal vertebral artery bilaterally due to atherosclerotic disease. Skeleton: Cervical spondylosis and degenerative change. No acute skeletal abnormality. Other neck: Negative for mass or edema.  No adenopathy. Upper chest: Lung apices are clear. Mild apical scarring bilaterally. Review of the MIP images confirms the above findings CTA HEAD FINDINGS Anterior circulation: Atherosclerotic calcification  in the cavernous carotid bilaterally without stenosis. A1 segment patent bilaterally. Occlusion of the right A2 segment. Severe disease in the left A3 branches. Middle cerebral artery patent bilaterally without occlusion. Moderate atherosclerotic disease in the middle cerebral artery branches bilaterally Posterior circulation: Moderate stenosis distal vertebral bilaterally. Basilar is diffusely diseased with multiple areas of moderate stenosis. PICA patent bilaterally. AICA patent bilaterally.  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 Mild disease in the posterior cerebral arteries bilaterally. Venous sinuses: Negative Anatomic variants: Negative for aneurysm. Delayed phase: Not perform Review of the MIP images confirms the above findings CT Brain Perfusion Findings: CBF (<30%) Volume: 0mL Perfusion (Tmax>6.0s) volume: 5mL. T-max greater than 4 seconds equal 17 mL. Mismatch Volume: 5mL Infarction Location:Right medial parietal lobe IMPRESSION: No significant carotid or vertebral artery stenosis in the neck. Moderate stenosis distal vertebral artery bilaterally. Diffuse disease in the basilar Severe intracranial atherosclerotic disease most notably in the anterior cerebral arteries bilaterally. Occlusion of the right A2 segment and diffuse disease in the left anterior cerebral artery. Middle cerebral artery branches are diffusely diseased. CT perfusion demonstrates mild delayed perfusion right medial frontal and parietal lobe, possibly anterior cerebral artery distribution. Right A2 occlusion could be acute based on this finding. No fixed infarction. Electronically Signed   By: Marlan Palau M.D.   On: 11/12/2017 10:28   Dg Chest Port 1 View  Result Date: 11/15/2017 CLINICAL DATA:  Fever. EXAM: PORTABLE CHEST 1 VIEW COMPARISON:  None. FINDINGS: Mild patchy opacity in the right base. No pneumothorax. No nodules or masses. No other focal infiltrates. Cardiomegaly. The hila and mediastinum are normal. IMPRESSION: Patchy opacity in the right base is worrisome for mild pneumonia given history. Recommend follow-up to resolution. Electronically Signed   By: Gerome Sam III M.D   On: 11/15/2017 15:39   Ct Head Code Stroke Wo Contrast  Result Date: 11/12/2017 CLINICAL DATA:  Code stroke.  Left-sided weakness and facial droop. EXAM: CT HEAD WITHOUT CONTRAST TECHNIQUE: Contiguous axial images were obtained from the base of the skull through the  vertex without intravenous contrast. COMPARISON:  None. FINDINGS: Brain: Moderate atrophy without hydrocephalus. Microvascular ischemia throughout the white matter bilaterally. Chronic lacunar infarct or cyst in the left basal ganglia. Hypodensity left internal capsule most likely due to chronic ischemia. No definite acute infarct.  Negative for hemorrhage or mass. Vascular: Negative for hyperdense vessel Skull: Negative Sinuses/Orbits: Mild mucosal edema paranasal sinuses. Bilateral cataract removal. Other: None ASPECTS ([place]ber[place] Stroke Program Early CT Score) - Ganglionic level infarction (caudate, lentiform nuclei, internal capsule, insula, M1-M3 cortex): 7 - Supraganglionic infarction (M4-M6 cortex): 3 Total score (0-10 with 10 being normal): 10 IMPRESSION: 1. No acute intracranial abnormality 2. ASPECTS is 10 3. Atrophy and chronic appearing ischemic changes in the white matter and left basal ganglia. 4. These results were called by telephone at the time of interpretation on 11/12/2017 at 9:33 am to Dr. Amada Jupiter, who verbally acknowledged these results. Electronically Signed   By: Marlan Palau M.D.   On: 11/12/2017 09:34    Procedures Procedures (including critical care time)  Medications Ordered in ED Medications  acetaminophen (TYLENOL) tablet 650 mg (has no administration in time range)    Or  acetaminophen (TYLENOL) solution 650 mg (has no administration in time range)    Or  acetaminophen (TYLENOL) suppository 650 mg (has no administration in time range)  insulin aspart (novoLOG) injection 0-15 Units (5 Units Subcutaneous Given 11/15/17 1147)  aspirin  EC tablet 81 mg (81 mg Oral Given 11/15/17 0815)  atorvastatin (LIPITOR) tablet 10 mg (10 mg Oral Given 11/14/17 1715)  sertraline (ZOLOFT) tablet 50 mg (50 mg Oral Given 11/15/17 0815)  diltiazem (CARDIZEM CD) 24 hr capsule 180 mg (180 mg Oral Given 11/15/17 0814)  bisacodyl (DULCOLAX) suppository 10 mg (has no administration in time range)   linagliptin (TRADJENTA) tablet 5 mg (5 mg Oral Given 11/15/17 0814)  pantoprazole (PROTONIX) EC tablet 40 mg (40 mg Oral Given 11/14/17 2127)  enoxaparin (LOVENOX) injection 30 mg (30 mg Subcutaneous Given 11/15/17 1036)  0.9 %  sodium chloride infusion ( Intravenous New Bag/Given 11/15/17 1036)  iopamidol (ISOVUE-370) 76 % injection (90 mLs  Contrast Given 11/12/17 0930)  alteplase (ACTIVASE) 1 mg/mL infusion 60.1 mg (0 mg Intravenous Stopped 11/12/17 1053)   stroke: mapping our early stages of recovery book ( Does not apply Given 11/12/17 1326)  labetalol (NORMODYNE,TRANDATE) injection 10 mg (10 mg Intravenous Given 11/12/17 0955)     Initial Impression / Assessment and Plan / ED Course  I have reviewed the triage vital signs and the nursing notes.  Pertinent labs & imaging results that were available during my care of the patient were reviewed by me and considered in my medical decision making (see chart for details).    84 year old female with symptoms concerning for acute CVA.  Evaluated by neurology.  TPA given.  Admission.  Final Clinical Impressions(s) / ED Diagnoses   Final diagnoses:  Cerebrovascular accident        ED Discharge Orders    None       Raeford RazorKohut, Ceili Boshers, MD 11/15/17 [phone]

## 2017-11-15 NOTE — NC FL2 (Addendum)
[place] MEDICAID FL2 LEVEL OF CARE SCREENING TOOL     IDENTIFICATION  Patient Name: Madison Kennedy Birthdate: 04/20/1920 Sex: female Admission Date (Current Location): 11/12/2017  [facility] [place]   Facility and Address:  [facility], [address], [place], [place] [address]      Provider Number: [id number]  Attending Physician Name and Address:  Micki RileySethi, Pramod S, MD  Relative Name and Phone Number:  Caffie DammeMary Rodenberg, [phone]    Current Level of Care: Hospital Recommended Level of Care: Skilled Nursing Facility Prior Approval Number:    Date Approved/Denied:   PASRR Number: [id number]  Discharge Plan: SNF    Current Diagnoses: Patient Active Problem List   Diagnosis Date Noted  . 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 Chronic anemia 04/30/2017  . Cataract cortical, senile 04/06/2017  . Benign essential hypertension 04/06/2017  . GERD (gastroesophageal reflux disease) 04/06/2017  . PAC (premature atrial contraction) 04/06/2017  . Degenerative lumbar spinal stenosis 09/19/2015  . Lumbar spondylosis 09/19/2015  . Primary osteoarthritis of right hip 09/19/2015  . Diabetes mellitus type 2, controlled, without complications (HCC) 11/02/2014  . Hyperlipidemia, mixed 11/02/2014  . Chronic hoarseness 05/03/2014  . Idiopathic peripheral neuropathy 05/03/2014    Orientation RESPIRATION BLADDER Height & Weight     Self, Place  Normal Incontinent, External catheter(Placed 4/4.) Weight: 132 lb 4.4 oz (60 kg) Height:  5\' 5"  (165.1 cm)  BEHAVIORAL SYMPTOMS/MOOD NEUROLOGICAL BOWEL NUTRITION STATUS      Continent Diet(Carb modified, thin liquids)  AMBULATORY STATUS COMMUNICATION OF NEEDS Skin   Total Care Verbally Normal                       Personal Care Assistance Level of Assistance  Bathing, Feeding, Dressing Bathing Assistance: Maximum assistance Feeding assistance: Limited assistance Dressing Assistance:  Maximum assistance     Functional Limitations Info  Sight, Hearing, Speech Sight Info: Adequate Hearing Info: Adequate Speech Info: Adequate    SPECIAL CARE FACTORS FREQUENCY  PT (By licensed PT), OT (By licensed OT)     PT Frequency: 3x OT Frequency: 3x            Contractures Contractures Info: Not present    Additional Factors Info  Code Status, Allergies Code Status Info: DNR Allergies Info: Ace Inhibitors, Tramadol           Current Medications (11/15/2017):  This is the current hospital active medication list Current Facility-Administered Medications  Medication Dose Route Frequency Provider Last Rate Last Dose  . 0.9 %  sodium chloride infusion   Intravenous Continuous Rinehuls, David L, PA-C 50 mL/hr at 11/15/17 1036    . acetaminophen (TYLENOL) tablet 650 mg  650 mg Oral Q4H PRN Layne BentonBiby, Sharon L, NP       Or  . acetaminophen (TYLENOL) solution 650 mg  650 mg Per Tube Q4H PRN Layne BentonBiby, Sharon L, NP       Or  . acetaminophen (TYLENOL) suppository 650 mg  650 mg Rectal Q4H PRN Layne BentonBiby, Sharon L, NP      . aspirin EC tablet 81 mg  81 mg Oral Daily Annie MainBiby, Sharon L, NP   81 mg at 11/15/17 0815  . atorvastatin (LIPITOR) tablet 10 mg  10 mg Oral q1800 Annie MainBiby, Sharon L, NP   10 mg at 11/14/17 1715  . bisacodyl (DULCOLAX) suppository 10 mg  10 mg Rectal Daily PRN Layne BentonBiby, Sharon L, NP      .  diltiazem (CARDIZEM CD) 24 hr capsule 180 mg  180 mg Oral Daily Annie Main L, NP   180 mg at 11/15/17 0814  . enoxaparin (LOVENOX) injection 30 mg  30 mg Subcutaneous Q24H Rinehuls, David L, PA-C   30 mg at 11/15/17 1036  . insulin aspart (novoLOG) injection 0-15 Units  0-15 Units Subcutaneous TID WC Annie Main L, NP   5 Units at 11/15/17 1147  . linagliptin (TRADJENTA) tablet 5 mg  5 mg Oral Daily Micki Riley, MD   5 mg at 11/15/17 0814  . pantoprazole (PROTONIX) EC tablet 40 mg  40 mg Oral QHS Micki Riley, MD   40 mg at 11/14/17 2127  . sertraline (ZOLOFT) tablet 50 mg  50 mg Oral  Daily Layne Benton, NP   50 mg at 11/15/17 1610     Discharge Medications: Please see discharge summary for a list of discharge medications.  Relevant Imaging Results:  Relevant Lab Results:   Additional Information SSN: [id number]  Maree Krabbe, LCSW  I have personally examined this patient, reviewed notes, independently viewed imaging studies, participated in medical decision making and plan of care.ROS completed by me personally and pertinent positives fully documented  I have made any additions or clarifications directly to the above note. Agree with note above.    Delia Heady, MD Medical Director [facility] Pager: [phone] 11/17/2017 8:26 AM

## 2017-11-15 NOTE — Discharge Summary (Addendum)
Stroke Discharge Summary  Patient ID: Madison Kennedy   MRN: [id number]      DOB: 10-Oct-1919  Date of Admission: 11/12/2017 Date of Discharge: 11/17/2017  Attending Physician:  Marvel Plan, MD, Stroke MD Consultant(s):  none Patient's PCP:  Madison Bosworth, MD  DISCHARGE DIAGNOSIS:  Acute infarct of the right anterior cerebral artery territory treated with tPA. Active Problems:   Stroke (cerebrum) [facility])   Past Medical History:  Diagnosis Date  . Diabetes mellitus without complication (HCC)   . Hypertension    History reviewed. No pertinent surgical history.  Allergies as of 11/17/2017      Reactions   Ace Inhibitors Other (See Comments)   Tramadol Other (See Comments)   drowsiness      Medication List    STOP taking these medications   acetaminophen 500 MG tablet Commonly known as:  TYLENOL   amoxicillin-clavulanate 500-125 MG tablet Commonly known as:  AUGMENTIN   bisacodyl 10 MG suppository Commonly known as:  DULCOLAX   diltiazem 180 MG 24 hr capsule Commonly known as:  TIAZAC   FLUZONE HIGH-DOSE 0.5 ML injection Generic drug:  Influenza vac split quadrivalent PF   glipiZIDE 5 MG tablet Commonly known as:  GLUCOTROL   PREVNAR 13 Susp injection Generic drug:  pneumococcal 13-valent conjugate vaccine   traMADol 50 MG tablet Commonly known as:  ULTRAM     TAKE these medications   Alogliptin Benzoate 12.5 MG Tabs Take by mouth.   apixaban 2.5 MG Tabs tablet Commonly known as:  ELIQUIS Take 1 tablet (2.5 mg total) by mouth 2 (two) times daily. Start taking on:  11/18/2017   atorvastatin 10 MG tablet Commonly known as:  LIPITOR Take 1 tablet (10 mg total) by mouth daily at 6 PM. What changed:    how much to take  when to take this   cefdinir 300 MG capsule Commonly known as:  OMNICEF Take 1 capsule (300 mg total) by mouth daily.   diltiazem 180 MG 24 hr capsule Commonly known as:  CARDIZEM CD Take 1 capsule (180 mg total) by mouth  daily. Start taking on:  11/18/2017   esomeprazole 40 MG capsule Commonly known as:  NEXIUM Take by mouth.   glimepiride 4 MG tablet Commonly known as:  AMARYL Take 6 mg by mouth See admin instructions. Taking 1 tablet in the AM and 1/2 tablet in the afternoon   ICAPS PO Take by mouth.   metoprolol tartrate 25 MG tablet Commonly known as:  LOPRESSOR Take 1 tablet (25 mg total) by mouth 2 (two) times daily.   NONFORMULARY OR COMPOUNDED ITEM Apply 1-2 g topically 4 (four) times daily.   sertraline 50 MG tablet Commonly known as:  ZOLOFT Take 1 tablet (50 mg total) by mouth daily. Start taking on:  11/18/2017 What changed:    how much to take  when to take this   VITRON-C 65-125 MG Tabs Generic drug:  Iron-Vitamin C Take by mouth.       LABORATORY STUDIES CBC    Component Value Date/Time   WBC 11.8 (H) 11/17/2017 0553   RBC 3.08 (L) 11/17/2017 0553   HGB 9.4 (L) 11/17/2017 0553   HCT 28.7 (L) 11/17/2017 0553   PLT 235 11/17/2017 0553   MCV 93.2 11/17/2017 0553   MCH 30.5 11/17/2017 0553   MCHC 32.8 11/17/2017 0553   RDW 15.5 11/17/2017 0553   LYMPHSABS 2.2 11/12/2017 0918   MONOABS 0.5 11/12/2017 0865  EOSABS 0.1 11/12/2017 0918   BASOSABS 0.0 11/12/2017 0918   CMP    Component Value Date/Time   NA 136 11/17/2017 0553   K 3.9 11/17/2017 0553   CL 107 11/17/2017 0553   CO2 19 (L) 11/17/2017 0553   GLUCOSE 141 (H) 11/17/2017 0553   BUN 39 (H) 11/17/2017 0553   CREATININE 1.77 (H) 11/17/2017 0553   CALCIUM 8.4 (L) 11/17/2017 0553   PROT 6.0 (L) 11/12/2017 0918   ALBUMIN 3.2 (L) 11/12/2017 0918   AST 24 11/12/2017 0918   ALT 15 11/12/2017 0918   ALKPHOS 86 11/12/2017 0918   BILITOT 0.7 11/12/2017 0918   GFRNONAA 23 (L) 11/17/2017 0553   GFRAA 27 (L) 11/17/2017 0553   COAGS Lab Results  Component Value Date   INR 1.12 11/12/2017   Lipid Panel    Component Value Date/Time   CHOL 192 11/13/2017 0443   TRIG 199 (H) 11/13/2017 0443   TRIG 189  (H) 11/13/2017 0443   HDL 36 (L) 11/13/2017 0443   CHOLHDL 5.3 11/13/2017 0443   VLDL 40 11/13/2017 0443   LDLCALC 116 (H) 11/13/2017 0443   HgbA1C  Lab Results  Component Value Date   HGBA1C 7.3 (H) 11/13/2017   Urinalysis    Component Value Date/Time   COLORURINE YELLOW 11/15/2017 1508   APPEARANCEUR HAZY (A) 11/15/2017 1508   LABSPEC 1.025 11/15/2017 1508   PHURINE 5.0 11/15/2017 1508   GLUCOSEU NEGATIVE 11/15/2017 1508   HGBUR SMALL (A) 11/15/2017 1508   BILIRUBINUR NEGATIVE 11/15/2017 1508   KETONESUR NEGATIVE 11/15/2017 1508   PROTEINUR 100 (A) 11/15/2017 1508   NITRITE POSITIVE (A) 11/15/2017 1508   LEUKOCYTESUR LARGE (A) 11/15/2017 1508   Urine Drug Screen     Component Value Date/Time   LABOPIA NONE DETECTED 11/12/2017 1646   COCAINSCRNUR NONE DETECTED 11/12/2017 1646   LABBENZ NONE DETECTED 11/12/2017 1646   AMPHETMU NONE DETECTED 11/12/2017 1646   THCU NONE DETECTED 11/12/2017 1646   LABBARB NONE DETECTED 11/12/2017 1646    Alcohol Level    Component Value Date/Time   ETH <10 11/12/2017 0918     SIGNIFICANT DIAGNOSTIC STUDIES Ct Head Code Stroke Wo Contrast 11/12/2017 IMPRESSION:  1. No acute intracranial abnormality  2. ASPECTS is 10  3. Atrophy and chronic appearing ischemic changes in the white matter and left basal ganglia.   Ct Angio Head W Or Wo Contrast Ct Angio Neck W Or Wo Contrast Ct Cerebral Perfusion W Contrast 11/12/2017 IMPRESSION:  No significant carotid or vertebral artery stenosis in the neck.  Moderate stenosis distal vertebral artery bilaterally.  Diffuse disease in the basilar  Severe intracranial atherosclerotic disease most notably in the anterior cerebral arteries bilaterally. Occlusion of the right A2 segment and diffuse disease in the left anterior cerebral artery.  Middle cerebral artery branches are diffusely diseased. CT perfusion demonstrates mild delayed perfusion right medial frontal and parietal lobe, possibly anterior  cerebral artery distribution.  Right A2 occlusion could be acute based on this finding. No fixed infarction.   Mr Brain Wo Contrast 11/13/2017 IMPRESSION:  1. Nonhemorrhagic acute infarct of the right anterior cerebral artery territory. No mass effect.  2. Generalized atrophy and sequelae of chronic ischemic microangiopathy.   Transthoracic Echocardiogram  11/13/2017 Study Conclusions - Left ventricle: The cavity size was normal. There was mild focal basal hypertrophy of the septum. Systolic function was moderately reduced. The estimated ejection fraction was in the range of 35% to 40%. Moderate diffuse hypokinesis. The study is  not technically sufficient to allow evaluation of LV diastolic function. - Aortic valve: Trileaflet; mildly thickened, mildly calcified leaflets. - Mitral valve: There was mild regurgitation. - Tricuspid valve: There was trivial regurgitation. - Pulmonary arteries: PA peak pressure: 32 mm Hg (S).     HISTORY OF PRESENT ILLNESS Madison Kennedy is a 84 y.o. female with a history of ?Atrial fibrillation(family thinks she has it and is on diltiazem for it, but is not sure).   She is not on any anticoagulation or antiplatelet therapy. She was feeling ill yesterday, felt to be due to constipation and was started on a suppository with results. She felt significantly better this morning initially, however shortly after getting up, she fell. She then had significant difficulty getting up. She was last known well 11/12/2017 at 8 AM.  Her modifiedRankin Scale: 1-No significant post stroke disability and can perform usual duties with stroke symptoms. She was administered IV TPA and admitted to the neuro ICU for further evaluation and treatment   HOSPITAL COURSE Ms. Madison Kennedy is a 84 y.o. female with history of AF not on AC, HTN and Diabetes presenting with L facial droop and expressive aphasia. She received tPA 11/12/2017 at 0954.  Stroke:   R ACA infarct embolic  secondary to known atrial fibrillation not on anticoagulation s/p IV tPA  Resultant L hemiplegia, dysarthria   CT head No acute stroke. Small vessel disease. Atrophy. ASPECTS 10.     CTA head Severe IC atherosclerosis, greatest in ACAs. Occlusion R A2.  CTA neck no sign stenosis. mod stenosis B VAs. Diffuse BA dz.   CT perfusion delayed perfusion R medial frontal and parietal lobe. No fixed infarct  MRI  R ACA infarct. Atrophy. Small vessel disease.   2D Echo - EF 35 - 40 %.  No cardiac source of emboli identified.  LDL 116  HgbA1c 7.2  Lovenox 30 mg sq daily for VTE prophylaxis  Fall precautions  Diet Carb Modified Fluid consistency: Thin; Room service appropriate? Yes  No antithrombotic prior to admission, now on Aspirin 325 mg daily. Changed to Eliquis 2.5 mg twice a day at time of discharge.  Therapy recommendations:  SNF  Disposition:  SNF (lived alone PTA in an elderly apt, fixing own meals)  Atrial Fibrillation  Home anticoagulation:  none   On asa 325 mg now  Plan AC at with Eliquis 2.5 mg bid at d/c    Accelerated Hypertension  Elevated on arrival, as high as 184/83   BP improved after addition of lopressor 4/8  On cardizem 180 at home, resumed in hospital  On lopressor 25 bid added with improvement in BP  Long-term BP goal normotensive  Hyperlipidemia  Home meds:  lipitor 10, resumed in hospital  LDL 116, goal < 70  Diabetes type II  HgbA1c 7.3 goal < 7.0  Infectious process  WBC 10.4->11.8. Low grade temperature  CXR 4/7 w/ R base infiltrate, ? PNA. Repeat CXR still with possible RLL airspace dz concerning for PNA  R FA IV infiltrate, improving  Pain improving  Elevate arm and cool/warm for comfort  UTI  UA 4/7 positive nitrites, lg leukocytes, WBC TNTC  Started on Rocephin 4/7  Change rocephin to omnicef po daily for total of 7d treatment  Urine Cx pending  Other Stroke Risk Factors  Advanced age  Former  Cigarette smoker  Anemia, chronic disease  Hgb 9.4  CKD  BUM/CR 139/1.77  Baseline Cr 1.7    DISCHARGE EXAM  Vitals:   11/17/17 0005 11/17/17 0437 11/17/17 0821 11/17/17 1222  BP: (!) 171/78 (!) 155/62 (!) 159/63 (!) 148/66  Pulse: 84 68 71 76  Resp: 18 16 16 18   Temp: 98.4 F (36.9 C) 97.7 F (36.5 C) 98.2 F (36.8 C) 98.4 F (36.9 C)  TempSrc: Oral Oral Oral Oral  SpO2: 97% 95% 95% 97%  Weight:      Height:        PHYSICAL EXAM Pleasant elderly Caucasian lady currently who is sitting up in chair, alert. Head is nontraumatic. Neck is supple without bruit.Cardiac exam no murmur or gallop. Lungs are clear to auscultation. R FA with decreased redness compared to yesterday. No longer warm to touch, some edema remains. Sore to touch at the inner wrist area. pulses are well felt. Neurological Exam :  Awake alert oriented 2. Knows her age and that her birthday was yesterday. Significant dysarthria, difficult to understood. Follows 1 step commands. Has perseveration of speech, appears aphasic. Extraocular moments are full range without nystagmus. Pupils irregular but reactive. Fundi not visualized. Left lower facial weakness. Tongue midline. Dense left hemiplegia with 0/5 left upper extremity and 2/5 left lower extremity weakness. Sensation appears diminished on the left compared to the right. Tone is reduced on the left compared to the right. .Left plantar upgoing right downgoing. Gait not tested.   Discharge Diet : Diet Carb Modified Fluid consistency: Thin  DISCHARGE PLAN  Disposition:  Discharge to skilled nursing facility for ongoing PT, OT and ST.   Eliquis (apixaban) daily 2.5mg  for secondary stroke prevention.  Follow R arm IV infiltrate resolution  Monitor WBC and respiratory status  Complete abx course for UTI  Ongoing risk factor control by Primary Care Physician at time of discharge  Follow-up Madison Bosworth, MD in 2 weeks or MD at SNF  Follow-up in  [facility] in 4 weeks, office to schedule an appointment.   25 minutes were spent preparing discharge.  Annie Main, MSN, APRN, ANVP-BC, AGPCNP-BC Advanced Practice Stroke Nurse [facility] See Amion for Schedule & Pager information 11/17/2017 3:33 PM    ATTENDING NOTE: I reviewed above note and agree with the assessment and plan. I have made any additions or clarifications directly to the above note. Pt was seen and examined.   Pt lying in bed without discomfort. No fever. BP stable. Will start eliquis 2.5mg  bid for stroke prevention on discharge. CXR concerning for mild RLL airspace disease, and WBC 11.8. She is on rocephin for UTI. Will change to po omnicef on discharge to cover both. Pt will be discharged to SNF once bed available.   Marvel Plan, MD PhD Stroke Neurology 11/17/2017 6:57 PM

## 2017-11-15 NOTE — Evaluation (Signed)
Speech Language Pathology Evaluation Patient Details Name: Madison Kennedy MRN: [id number] DOB: 03-25-1920 Today's Date: 11/15/2017 Time: 2500-3704 SLP Time Calculation (min) (ACUTE ONLY): 15 min  Problem List:  Patient Active Problem List   Diagnosis Date Noted  . Stroke (cerebrum) ([place]) 11/12/2017  . Chronic anemia 04/30/2017  . Cataract cortical, senile 04/06/2017  . Benign essential hypertension 04/06/2017  . GERD (gastroesophageal reflux disease) 04/06/2017  . PAC (premature atrial contraction) 04/06/2017  . Degenerative lumbar spinal stenosis 09/19/2015  . Lumbar spondylosis 09/19/2015  . Primary osteoarthritis of right hip 09/19/2015  . Diabetes mellitus type 2, controlled, without complications ([place]) 88/89/1694  . Hyperlipidemia, mixed 11/02/2014  . Chronic hoarseness 05/03/2014  . Idiopathic peripheral neuropathy 05/03/2014   Past Medical History:  Past Medical History:  Diagnosis Date  . Diabetes mellitus without complication ([place])   . Hypertension    Past Surgical History: History reviewed. No pertinent surgical history. HPI:  Madison Kennedy is a 84 y.o. female with history of AF not on AC, HTN and DB presenting with L facial droop and expressive aphasia. She received tPA 11/12/2017 at 0954. Determined to have suffered an acute ACA infarct per MRI.     Assessment / Plan / Recommendation Clinical Impression  Pt presents with cognitive-linguistic deficits, including expressive>receptive language impairments, left inattention, impaired selective attention and visuoperception. Spontaneous responses are limited to single words, occasionally phrases. Echolalia and perseveration noted, and 50% of open-ended questions are met with echolalic responses. Pt named 1/4 animals correctly, errors dysnomic but semantically related. For common objects in room, naming 60% accurate. Pt able to read sequential digits correctly, with omission of digits in her left visual field noted.  Selective attention (reading digits only in specified shape) impaired. Automatic speech relatively intact. Unable to assess delayed recall as pt did not understand the task despite reinstruction, rephrasing. Of note, pt sitting with lunch tray relatively untouched. Vocal quality is mildly wet. Requested CNA assist with feeding. RN stroke swallow screen passed; will monitor/screen for swallow needs if appropriate. Skilled ST recommended to maximize functional communication, reduce caregiver burden and improve quality of life.     SLP Assessment  SLP Recommendation/Assessment: Patient needs continued Speech Lanaguage Pathology Services SLP Visit Diagnosis: Aphasia (R47.01);Cognitive communication deficit (R41.841)    Follow Up Recommendations  Skilled Nursing facility    Frequency and Duration min 2x/week  2 weeks      SLP Evaluation Cognition  Overall Cognitive Status: Impaired/Different from baseline Arousal/Alertness: Awake/alert Orientation Level: Oriented to person;Disoriented to place;Disoriented to time;Disoriented to situation Attention: Focused;Sustained Focused Attention: Appears intact Sustained Attention: Appears intact Memory: (UTA) Problem Solving: Impaired Problem Solving Impairment: Verbal basic       Comprehension  Auditory Comprehension Overall Auditory Comprehension: Impaired Yes/No Questions: Impaired Basic Biographical Questions: 51-75% accurate Complex Questions: 0-24% accurate Commands: Impaired One Step Basic Commands: 75-100% accurate Two Step Basic Commands: 0-24% accurate Conversation: Simple Other Conversation Comments: Intermittently responds to open ended questions; echolalia ~50% of the time Interfering Components: Hearing;Processing speed;Attention EffectiveTechniques: Visual/Gestural cues;Repetition;Extra processing time;Increased volume Visual Recognition/Discrimination Discrimination: Within Function Limits Reading Comprehension Reading  Status: Not tested Word level: Not tested Sentence Level: Not tested    Expression Expression Primary Mode of Expression: Verbal Verbal Expression Overall Verbal Expression: Impaired Initiation: No impairment Automatic Speech: Name;Social Response;Day of week;Counting Level of Generative/Spontaneous Verbalization: Word;Phrase Repetition: Impaired Level of Impairment: Word level Naming: Impairment Responsive: 0-25% accurate Confrontation: Impaired Convergent: Not tested Divergent: Not tested Verbal Errors: Perseveration;Echolalia;Semantic paraphasias(giraffe/zebra, turkey/peacock,  bear/tiger) Pragmatics: No impairment Non-Verbal Means of Communication: Not applicable Written Expression Dominant Hand: Right Written Expression: Not tested   Oral / Motor  Oral Motor/Sensory Function Overall Oral Motor/Sensory Function: Mild impairment Facial ROM: Reduced left;Suspected CN VII (facial) dysfunction Facial Symmetry: Abnormal symmetry left;Suspected CN VII (facial) dysfunction Facial Strength: Reduced left;Suspected CN VII (facial) dysfunction Lingual ROM: Within Functional Limits Lingual Symmetry: Within Functional Limits Velum: Within Functional Limits Mandible: Within Functional Limits Motor Speech Overall Motor Speech: Impaired Respiration: Impaired Level of Impairment: Phrase Phonation: Wet;Low vocal intensity Resonance: Within functional limits Articulation: Within functional limitis Intelligibility: Intelligible Motor Planning: Witnin functional limits Motor Speech Errors: Not applicable   [place], [place], [place] Speech-Language Pathologist [phone]  Aliene Altes 11/15/2017, 1:32 PM

## 2017-11-15 NOTE — Clinical Social Work Note (Signed)
Clinical Social Work Assessment  Patient Details  Name: Madison Kennedy MRN: [id number] Date of Birth: 12/03/1919  Date of referral:  11/15/17               Reason for consult:  Facility Placement                Permission sought to share information with:  Family Supports Permission granted to share information::     Name::     Corrie DandyMary  Agency::  [facility]  Relationship::  daughter  Contact Information:  [phone]  Housing/Transportation Living arrangements for the past 2 months:  Independent Living Facility([facility]) Source of Information:  Adult Children Patient Interpreter Needed:  None Criminal Activity/Legal Involvement Pertinent to Current Situation/Hospitalization:  No - Comment as needed Significant Relationships:  Adult Children Lives with:  Self Do you feel safe going back to the place where you live?  Yes Need for family participation in patient care:  No (Coment)  Care giving concerns:  Pt is only alert to self and place. CSW spoke with pt's daughter. Pt is from an ILF ([facility]).    Social Worker assessment / plan:  CSW spoke with pt's daughter via telephone. Pt's daughter is agreeable to pt go to SNF at d/c. Pt's daughter would like for pt to go to a facility in [place], as they [place] in [place]. CSW please follow up with b/o by calling son in law, Vermon at [phone] on 4/8.  Employment status:  Retired Health and safety inspectornsurance information:  Medicare PT Recommendations:  Skilled Nursing Facility Information / Referral to community resources:  Skilled Nursing Facility  Patient/Family's Response to care:  Pt's daughter verbalized understanding of CSW role and expressed appreciation for support. Pt's daughter denies any concern regarding pt care at this time.   Patient/Family's Understanding of and Emotional Response to Diagnosis, Current Treatment, and Prognosis:  Pt's daughter understanding and realistic regarding pt's physical limitations. Pt's daughter  understands the need for pt to go to SNF at d/c--pt's daughter agreeable at this time. Pt's daughter denies any concern regarding pt's treatment plan at this time. CSW will continue to provide support and facilitate d/c needs.   Emotional Assessment Appearance:  Appears stated age Attitude/Demeanor/Rapport:  Unable to Assess Affect (typically observed):  Unable to Assess Orientation:  Oriented to Self, Oriented to Place Alcohol / Substance use:  Not Applicable Psych involvement (Current and /or in the community):  No (Comment)  Discharge Needs  Concerns to be addressed:  Care Coordination, Basic Needs Readmission within the last 30 days:  No Current discharge risk:  Dependent with Mobility Barriers to Discharge:  Continued Medical Work up   Pacific MutualBridget A Mancil Pfenning, LCSW 11/15/2017, 4:30 PM

## 2017-11-16 ENCOUNTER — Inpatient Hospital Stay (HOSPITAL_COMMUNITY): Payer: Medicare Other

## 2017-11-16 DIAGNOSIS — N3000 Acute cystitis without hematuria: Secondary | ICD-10-CM

## 2017-11-16 DIAGNOSIS — E785 Hyperlipidemia, unspecified: Secondary | ICD-10-CM

## 2017-11-16 DIAGNOSIS — J181 Lobar pneumonia, unspecified organism: Secondary | ICD-10-CM

## 2017-11-16 DIAGNOSIS — E1159 Type 2 diabetes mellitus with other circulatory complications: Secondary | ICD-10-CM

## 2017-11-16 DIAGNOSIS — I63421 Cerebral infarction due to embolism of right anterior cerebral artery: Principal | ICD-10-CM

## 2017-11-16 DIAGNOSIS — I480 Paroxysmal atrial fibrillation: Secondary | ICD-10-CM

## 2017-11-16 DIAGNOSIS — I10 Essential (primary) hypertension: Secondary | ICD-10-CM

## 2017-11-16 LAB — CBC
HCT: 29 % — ABNORMAL LOW (ref 36.0–46.0)
Hemoglobin: 9.5 g/dL — ABNORMAL LOW (ref 12.0–15.0)
MCH: 30.8 pg (ref 26.0–34.0)
MCHC: 32.8 g/dL (ref 30.0–36.0)
MCV: 94.2 fL (ref 78.0–100.0)
Platelets: 213 10*3/uL (ref 150–400)
RBC: 3.08 MIL/uL — ABNORMAL LOW (ref 3.87–5.11)
RDW: 15.8 % — ABNORMAL HIGH (ref 11.5–15.5)
WBC: 10.4 10*3/uL (ref 4.0–10.5)

## 2017-11-16 LAB — GLUCOSE, CAPILLARY
GLUCOSE-CAPILLARY: 173 mg/dL — AB (ref 65–99)
GLUCOSE-CAPILLARY: 194 mg/dL — AB (ref 65–99)
Glucose-Capillary: 176 mg/dL — ABNORMAL HIGH (ref 65–99)
Glucose-Capillary: 128 mg/dL — ABNORMAL HIGH (ref 65–99)

## 2017-11-16 LAB — BASIC METABOLIC PANEL
Anion gap: 12 (ref 5–15)
BUN: 37 mg/dL — AB (ref 6–20)
CALCIUM: 8.1 mg/dL — AB (ref 8.9–10.3)
CHLORIDE: 105 mmol/L (ref 101–111)
CO2: 17 mmol/L — AB (ref 22–32)
CREATININE: 1.76 mg/dL — AB (ref 0.44–1.00)
GFR calc Af Amer: 27 mL/min — ABNORMAL LOW (ref >60)
GFR calc non Af Amer: 23 mL/min — ABNORMAL LOW (ref >60)
GLUCOSE: 161 mg/dL — AB (ref 65–99)
Potassium: 4.2 mmol/L (ref 3.5–5.1)
Sodium: 134 mmol/L — ABNORMAL LOW (ref 135–145)

## 2017-11-16 MED ORDER — METOPROLOL TARTRATE 25 MG PO TABS
25.0000 mg | ORAL_TABLET | Freq: Two times a day (BID) | ORAL | Status: DC
  Administered 2017-11-16 – 2017-11-17 (×3): 25 mg via ORAL
  Filled 2017-11-16 (×3): qty 1

## 2017-11-16 MED ORDER — ASPIRIN EC 325 MG PO TBEC
325.0000 mg | DELAYED_RELEASE_TABLET | Freq: Every day | ORAL | Status: DC
  Administered 2017-11-16 – 2017-11-17 (×2): 325 mg via ORAL
  Filled 2017-11-16 (×2): qty 1

## 2017-11-16 NOTE — Progress Notes (Signed)
Physical Therapy Treatment Patient Details Name: Madison Kennedy MRN: [id number] DOB: 05/19/1920 Today's Date: 11/16/2017    History of Present Illness pt is a 84 y/o female with pmh significant for HTN, DM and ?afib, admitted with s/s of stroke.  Determined to have suffered an acute ACA infarct per MRI.  ?Tpa given    PT Comments    Pt remains to have flat affect and expressive aphasia. Pt did not follow commands until sitting EOB and provided with food and drink. Pt did actively attempt to eat with R UE today however con't to require total assist for all mobility/transfers. Pt transferred to chair with assist of RN to eat lunch. Acute PT to con't to follow.   Follow Up Recommendations  SNF;Supervision/Assistance - 24 hour     Equipment Recommendations       Recommendations for Other Services       Precautions / Restrictions Precautions Precautions: Fall Precaution Comments: L hemiparesis Restrictions Weight Bearing Restrictions: No    Mobility  Bed Mobility Overal bed mobility: Needs Assistance Bed Mobility: Supine to Sit     Supine to sit: Total assist;+2 for physical assistance     General bed mobility comments: pt with no initiation of movement, total assist for LEs management of EOB and maxA for trunk elevation using bed pads  Transfers Overall transfer level: Needs assistance   Transfers: Squat Pivot Transfers     Squat pivot transfers: +2 physical assistance;+2 safety/equipment     General transfer comment: pt with no initiation or assist during transfer. pt dependent. RN present and assisted  Ambulation/Gait             General Gait Details: pt unable to amb   Stairs            Wheelchair Mobility    Modified Rankin (Stroke Patients Only) Modified Rankin (Stroke Patients Only) Pre-Morbid Rankin Score: No symptoms Modified Rankin: Severe disability     Balance Overall balance assessment: Needs assistance Sitting-balance support:  Feet supported;No upper extremity supported Sitting balance-Leahy Scale: Zero Sitting balance - Comments: pt falls to the left, maxA to maintain EOB sitting and midline. Pt sat EOB x 6 min and ate half of her roll and drank some water using R hand and max v/c's to sequence Postural control: Left lateral lean                                  Cognition Arousal/Alertness: Lethargic Behavior During Therapy: Flat affect Overall Cognitive Status: Impaired/Different from baseline Area of Impairment: Problem solving                   Current Attention Level: Sustained         Problem Solving: Slow processing;Requires verbal cues;Requires tactile cues;Difficulty sequencing General Comments: once sitting EOB, with max v/c's pt able to grab roll of plate and attempted to bring it to mouth, minA needed to complete task. pt did reach with R UE for drink as well      Exercises Other Exercises Other Exercises: AAROM to R UE and R LE. pt with minimal assist but appears to have normal tone Other Exercises: PROM to L UE- flaccid, no assist Other Exercises: PROM to L LE, pt with increased tone into extension    General Comments General comments (skin integrity, edema, etc.): pt with very thin skin with multiple bruises and skin tears  Pertinent Vitals/Pain Pain Assessment: Faces Faces Pain Scale: Hurts whole lot Pain Location: R UE with movement Pain Descriptors / Indicators: Grimacing Pain Intervention(s): Monitored during session    Home Living                      Prior Function            PT Goals (current goals can now be found in the care plan section) Acute Rehab PT Goals Patient Stated Goal: unable to state Progress towards PT goals: Progressing toward goals    Frequency    Min 3X/week      PT Plan Current plan remains appropriate    Co-evaluation              AM-PAC PT "6 Clicks" Daily Activity  Outcome Measure  Difficulty  turning over in bed (including adjusting bedclothes, sheets and blankets)?: Unable Difficulty moving from lying on back to sitting on the side of the bed? : Unable Difficulty sitting down on and standing up from a chair with arms (e.g., wheelchair, bedside commode, etc,.)?: Unable Help needed moving to and from a bed to chair (including a wheelchair)?: Total Help needed walking in hospital room?: Total Help needed climbing 3-5 steps with a railing? : Total 6 Click Score: 6    End of Session Equipment Utilized During Treatment: Gait belt Activity Tolerance: Patient tolerated treatment well Patient left: in chair;with call bell/phone within reach;with nursing/sitter in room Nurse Communication: Mobility status PT Visit Diagnosis: Other abnormalities of gait and mobility (R26.89);Hemiplegia and hemiparesis Hemiplegia - Right/Left: Left Hemiplegia - dominant/non-dominant: Non-dominant Hemiplegia - caused by: Cerebral infarction     Time: 1130-1156 PT Time Calculation (min) (ACUTE ONLY): 26 min  Charges:  $Therapeutic Activity: 8-22 mins $Neuromuscular Re-education: 8-22 mins                    G Codes:       Lewis Shock, PT, DPT Pager #: [phone] Office #: [phone]    Annmarie Plemmons M Gareld Obrecht 11/16/2017, 12:33 PM

## 2017-11-16 NOTE — Progress Notes (Addendum)
STROKE TEAM PROGRESS NOTE   INTERVAL HISTORY No family members present today.  Patient lying in the bed. R FA with IV is red, warm and sore to touch, some edema and infiltrate. Pt reports it is sore.    CBC:  CBC Latest Ref Rng & Units 11/16/2017 11/13/2017 11/13/2017  WBC 4.0 - 10.5 K/uL 10.4 8.1 SPECIMEN CLOTTED  Hemoglobin 12.0 - 15.0 g/dL 8.1(X) 9.1(Y) SPECIMEN CLOTTED  Hematocrit 36.0 - 46.0 % 29.0(L) 30.7(L) SPECIMEN CLOTTED  Platelets 150 - 400 K/uL 213 215 SPECIMEN CLOTTED     Comprehensive Metabolic Panel:   CMP Latest Ref Rng & Units 11/16/2017 11/12/2017 11/12/2017  Glucose 65 - 99 mg/dL 782(N) 90 92  BUN 6 - 20 mg/dL 56(O) 13(Y) 86(V)  Creatinine 0.44 - 1.00 mg/dL 7.84(O) 9.62(X) 5.28(U)  Sodium 135 - 145 mmol/L 134(L) 141 142  Potassium 3.5 - 5.1 mmol/L 4.2 4.0 4.0  Chloride 101 - 111 mmol/L 105 107 107  CO2 22 - 32 mmol/L 17(L) - 23  Calcium 8.9 - 10.3 mg/dL 8.1(L) - 9.1  Total Protein 6.5 - 8.1 g/dL - - 6.0(L)  Total Bilirubin 0.3 - 1.2 mg/dL - - 0.7  Alkaline Phos 38 - 126 U/L - - 86  AST 15 - 41 U/L - - 24  ALT 14 - 54 U/L - - 15   Lipid Panel:     Component Value Date/Time   CHOL 192 11/13/2017 0443   TRIG 199 (H) 11/13/2017 0443   TRIG 189 (H) 11/13/2017 0443   HDL 36 (L) 11/13/2017 0443   CHOLHDL 5.3 11/13/2017 0443   VLDL 40 11/13/2017 0443   LDLCALC 116 (H) 11/13/2017 0443   HgbA1c:  Lab Results  Component Value Date   HGBA1C 7.3 (H) 11/13/2017   Urine Drug Screen:     Component Value Date/Time   LABOPIA NONE DETECTED 11/12/2017 1646   COCAINSCRNUR NONE DETECTED 11/12/2017 1646   LABBENZ NONE DETECTED 11/12/2017 1646   AMPHETMU NONE DETECTED 11/12/2017 1646   THCU NONE DETECTED 11/12/2017 1646   LABBARB NONE DETECTED 11/12/2017 1646    Alcohol Level     Component Value Date/Time   ETH <10 11/12/2017 0918    Vitals:   11/15/17 1951 11/16/17 0039 11/16/17 0423 11/16/17 0816  BP: (!) 163/71 (!) 172/75 (!) 172/73 (!) 172/73  Pulse: 79 82 84 82   Resp: 16 14 16 14   Temp: 99.7 F (37.6 C) 97.7 F (36.5 C) (!) 97.5 F (36.4 C) 100.3 F (37.9 C)  TempSrc: Oral Oral Oral Oral  SpO2: 91% 94% 98% 94%  Weight:      Height:       PHYSICAL EXAM Pleasant elderly Caucasian lady currently who is grimacing to touch. Low grade temperature. Head is nontraumatic. Neck is supple without bruit.    Cardiac exam no murmur or gallop. Lungs are clear to auscultation. R FA with IV red, warm, edematous and sore to touch from elbow to fingertips. Grimaces to touch. Distal pulses are well felt. Neurological Exam :  Awake alert oriented 2. Significant dysarthria, difficult to understood. Follows 1 step commands. Has perseveration of speech, appears aphasic, repeating "hurts". Extraocular moments are full range without nystagmus. Pupils irregular but reactive. Fundi not visualized. Left lower facial weakness. Tongue midline. Dense left hemiplegia with 0/5 left upper extremity and 1/5 left lower extremity weakness. Sensation appears diminished on the left compared to the right. Tone is reduced on the left compared to the right. Reflexes absent on  the left present on the right. Left plantar upgoing right downgoing. Gait not tested.   ASSESSMENT/PLAN Ms. DARREN NODAL is a 84 y.o. female with history of AF not on AC, HTN and Diabetes presenting with L facial droop and expressive aphasia. She received tPA 11/12/2017 at 0954.  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 Small vessel disease. Atrophy. ASPECTS 10.     CTA head Severe IC atherosclerosis, greatest in ACAs. Occlusion R A2.  CTA neck no sign stenosis. mod stenosis B VAs. Diffuse BA dz.   CT perfusion delayed perfusion R medial frontal and parietal lobe. No fixed infarct  MRI  R ACA infarct. Atrophy. Small vessel disease.   2D Echo - EF 35 - 40 %.  No cardiac source of emboli  identified.  LDL 116  HgbA1c 7.2  Lovenox 30 mg sq daily for VTE prophylaxis Fall precautions Diet Carb Modified Fluid consistency: Thin; Room service appropriate? Yes  No antithrombotic prior to admission, now on Aspirin 325 mg daily. Plan AC at time of d/c if remains stable.  Therapy recommendations:  SNF  Disposition:  SNF (lived alone PTA in an elderly apt, fixing own meals)  Atrial Fibrillation  Home anticoagulation:  none   On asa 325 mg now  on lopressor 25 bid  Plan AC at d/c if remains stable.  Accelerated Hypertension  Elevated on arrival, as high as 184/83   BP in the 170s, gradually elevating during hospitalization  On cardizem 180 at home, resumed in hospital  Also on metoprolol 25mg  bid . Long-term BP goal normotensive  Hyperlipidemia  Home meds:  lipitor 10, resumed in hospital  LDL 116, goal < 70  Diabetes type II  HgbA1c 7.3 goal < 7.0  CBGs  SSI  Infectious process  WBC 10.4. Low grade temperature  CXR 4/7 w/ R base infiltrate, ? PNA. Repeat CXR  Likely R FA cellulitis, possible thrombophlebitis  From IV infiltration  Painful  IVF d/c'd  Elevate arm and cool/warm for comfort  UTI  UA 4/7 positive nitrites, lg leukocytes, WBC TNTC  Started on Rocephin 4/7  Urine Cx pending  Other Stroke Risk Factors  Advanced age  Former Cigarette smoker  UDS / ETOH levels - normal  Anemia, chronic disease  Hgb 9.5  repeat CBC in a.m. X 2 days  CKD  Cr 1.6, 1.7. Baseline 1.7   BUN and creatinine creeping up -gently hydrate -EF 35-40% - Bmet in AM x 3 days   Plan / Recommendations   Stroke workup: completed  Therapy Follow Up: SNF  Disposition: SNF, maybe tomorrow depending on BP control  Antiplatelet / Anticoagulation: ASA 325 mg daily currently.  Considering anticoagulation for atrial fibrillation at d/c   Statin: Lipitor 10 mg daily  MD Follow Up: stroke clinic  Further risk factor modification per primary  care MD: Follow Up 2 weeks  F/u BUN and creatinine   F/u CBC   Hospital day # 4  Annie Main, MSN, APRN, ANVP-BC, AGPCNP-BC Advanced Practice Stroke Nurse [facility] See Amion for Schedule & Pager information 11/16/2017 10:38 AM     ATTENDING NOTE: I reviewed above note and agree with the assessment and plan. I have made any additions or clarifications directly to the above note. Pt was seen and examined.   Pt sitting in chair during round. She is  lethargic but awake alert and orientated to place, self, but not orientated to age and time. Severe dysarthria, paucity of speech, able to following simple commands. Naming 3/3, only able to repeat words but not sentences. Left facial droop, visual field full, tongue midline. Left UE flaccid with increased tone, left LE withdraw with pain, positive babinski. RUE 3-/5 due to pain and RLE 3-/5 and moving toes well.   She has hx of HTN, DM, afib not on AC. Admitted after a fall with left facial droop, left sided weakness, slurry speech, s/p tPA. CT no acute stroke but left BG old infarct. CTA h/n b/l ACA intracranial stenosis with right A2 occlusion. Hypoplastic posterior circulation due to b/l fetal PCAs. MRI showed right ACA moderate sized infarct. EF 35-40%. LDL 116 and A1C 7.3. UA WBC TNTC. On ASA 325mg  and lipitor 10 now. Will consider switch to eliquis on discharge.   Pt still has high BP, currently on cardizem but added metoprolol 25mg  bid. Will continue monitor. Goal SBP < 160 so that [facility]. Cre stable at baseline, will d/c IVF and encourage PO intake. Hb 9.7, lower than baseline, will close monitor. CXR concerning for right LL pneumonia. Currently on rocephin. Urine culture pending.   Marvel PlanJindong Jaqlyn Gruenhagen, MD PhD Stroke Neurology 11/16/2017 1:55 PM   To contact Stroke Continuity provider, please refer to [email or web address]. After hours, contact General Neurology

## 2017-11-16 NOTE — Progress Notes (Signed)
Late Entry  11/13/17 1500  SLP Visit Information  SLP Received On 11/13/17  SLP Time Calculation  SLP Start Time (ACUTE ONLY) 1450  SLP Stop Time (ACUTE ONLY) 1513  SLP Time Calculation (min) (ACUTE ONLY) 23 min  General Information  HPI Pt is a 84 y/o female with pmh significant for HTN, DM and ?afib, admitted with s/s of stroke.  Determined to have suffered an acute right ACA infarct per MRI.   Prior Functional Status  Cognitive/Linguistic Baseline Information not available  Oral Motor/Sensory Function  Overall Oral Motor/Sensory Function WFL  Cognition  Overall Cognitive Status Impaired/Different from baseline  Arousal/Alertness Awake/alert  Orientation Level Disoriented to place;Disoriented to time;Oriented to person  Attention Focused;Sustained  Focused Attention Appears intact  Sustained Attention Appears intact  Memory  (UTA)  Problem Solving Impaired  Problem Solving Impairment Verbal basic  Auditory Comprehension  Overall Auditory Comprehension Impaired  Yes/No Questions X  Basic Biographical Questions 26-50% accurate  Complex Questions 0-24% accurate  Commands X  One Step Basic Commands 75-100% accurate  Two Step Basic Commands 0-24% accurate  Conversation Simple  Visual Recognition/Discrimination  Discrimination WFL  Reading Comprehension  Reading Status X  Word level Not tested  Sentence Level Impaired  Verbal Expression  Overall Verbal Expression Impaired  Initiation No impairment  Automatic Speech Name;Social Response  Level of Generative/Spontaneous Verbalization Word;Phrase  Repetition No impairment  Naming Impairment  Responsive 0-25% accurate  Confrontation Impaired  Convergent Not tested  Divergent Not tested  Verbal Errors Perseveration  Written Expression  Dominant Hand Right  Motor Speech  Overall Motor Speech Appears within functional limits for tasks assessed  Respiration WFL  Phonation Wet  Resonance [facility]  Articulation WFL   Intelligibility Intelligible  Motor Planning WFL  SLP - End of Session  Patient left in bed  Assessment  Clinical Impression Statement (ACUTE ONLY) Pt demonstrates a moderate aphasia with expressive greater than receptive deficits. Pt is able to follow one step commands and 25% of two step commands or complex y/N questions. She can repeat and name familiar objects and pictures but is minimally communicative, does not initaite speech. Pt is moderately perseverative, which impacts accuracy with expressive tasks; pt often repeat Yes regardless of comprehension. She is unable to read phrases but can write her name clearly and appears to be right handed given her writing. Pt will benefit from f/u SLP therapy for pt and family education and to faciltiate functional communication. Recommend SNF at d/c.   SLP Recommendation/Assessment Patient needs continued Speech Lanaguage Pathology Services  Plan  Speech Therapy Frequency (ACUTE ONLY) min 2x/week  Duration 2 weeks  Treatment/Interventions Language facilitation;SLP instruction and feedback;Compensatory strategies;Patient/family education  Potential to Achieve Goals (ACUTE ONLY) Fair  SLP Recommendations  Follow up Recommendations Skilled Nursing facility  Individuals Consulted  Consulted and Agree with Results and Recommendations Patient  SLP Evaluations  $ SLP Speech Visit 1 Visit  SLP Evaluations  $ SLP EVAL LANGUAGE/SOUND PRODUCTION 1 Procedure  $Speech Treatment for Individual 1 Procedure

## 2017-11-16 NOTE — Progress Notes (Signed)
CSW following for discharge plan. CSW spoke with patient's son in law Vern, who wanted to discuss bed offers. CSW provided bed offers. Patient's son in law said [facility] and second [facility]. [facility]; they will have a bed for the patient tomorrow.  CSW will continue to follow.  Blenda NicelyElizabeth Roemello Speyer, [place]CS[place] Clinical Social Worker [phone]

## 2017-11-16 NOTE — Care Management Important Message (Signed)
Important Message  Patient Details  Name: Madison Kennedy MRN: [id number] Date of Birth: 07/16/1920   Medicare Important Message Given:    Due to illness patient not able to sign/Unsigned copy left at bedside  Dorena BodoIris Donaven Criswell 11/16/2017, 2:07 PM

## 2017-11-17 ENCOUNTER — Encounter
Admission: RE | Admit: 2017-11-17 | Discharge: 2017-11-17 | Disposition: A | Discharge status: Home / Self Care | Payer: Medicare Other | Source: Ambulatory Visit | Attending: Internal Medicine | Admitting: Internal Medicine

## 2017-11-17 LAB — GLUCOSE, CAPILLARY
GLUCOSE-CAPILLARY: 141 mg/dL — AB (ref 65–99)
Glucose-Capillary: 139 mg/dL — ABNORMAL HIGH (ref 65–99)
Glucose-Capillary: 130 mg/dL — ABNORMAL HIGH (ref 65–99)

## 2017-11-17 LAB — CBC
HCT: 28.7 % — ABNORMAL LOW (ref 36.0–46.0)
Hemoglobin: 9.4 g/dL — ABNORMAL LOW (ref 12.0–15.0)
MCH: 30.5 pg (ref 26.0–34.0)
MCHC: 32.8 g/dL (ref 30.0–36.0)
MCV: 93.2 fL (ref 78.0–100.0)
Platelets: 235 10*3/uL (ref 150–400)
RBC: 3.08 MIL/uL — ABNORMAL LOW (ref 3.87–5.11)
RDW: 15.5 % (ref 11.5–15.5)
WBC: 11.8 10*3/uL — ABNORMAL HIGH (ref 4.0–10.5)

## 2017-11-17 LAB — BASIC METABOLIC PANEL
Anion gap: 10 (ref 5–15)
BUN: 39 mg/dL — AB (ref 6–20)
CALCIUM: 8.4 mg/dL — AB (ref 8.9–10.3)
CO2: 19 mmol/L — AB (ref 22–32)
CREATININE: 1.77 mg/dL — AB (ref 0.44–1.00)
Chloride: 107 mmol/L (ref 101–111)
GFR calc Af Amer: 27 mL/min — ABNORMAL LOW (ref >60)
GFR, EST NON AFRICAN AMERICAN: 23 mL/min — AB (ref >60)
GLUCOSE: 141 mg/dL — AB (ref 65–99)
Potassium: 3.9 mmol/L (ref 3.5–5.1)
Sodium: 136 mmol/L (ref 135–145)

## 2017-11-17 MED ORDER — DILTIAZEM HCL ER COATED BEADS 180 MG PO CP24: 180.0000 mg | ORAL_CAPSULE | Freq: Every day | ORAL | Status: AC

## 2017-11-17 MED ORDER — ATORVASTATIN CALCIUM 10 MG PO TABS: 10.0000 mg | ORAL_TABLET | Freq: Every day | ORAL | 2 refills | Status: AC

## 2017-11-17 MED ORDER — CEFDINIR 300 MG PO CAPS
300.0000 mg | ORAL_CAPSULE | Freq: Every day | ORAL | 0 refills | Status: DC
Start: 2017-11-17 — End: 2017-11-26

## 2017-11-17 MED ORDER — METOPROLOL TARTRATE 25 MG PO TABS: 25.0000 mg | ORAL_TABLET | Freq: Two times a day (BID) | ORAL | Status: AC

## 2017-11-17 MED ORDER — CEFDINIR 300 MG PO CAPS
300.0000 mg | ORAL_CAPSULE | Freq: Every day | ORAL | Status: DC
  Administered 2017-11-17: 300 mg via ORAL
  Filled 2017-11-17: qty 1

## 2017-11-17 MED ORDER — APIXABAN 2.5 MG PO TABS
2.5000 mg | ORAL_TABLET | Freq: Two times a day (BID) | ORAL | Status: DC
Start: 2017-11-18 — End: 2017-11-17

## 2017-11-17 MED ORDER — APIXABAN 2.5 MG PO TABS: 2.5000 mg | ORAL_TABLET | Freq: Two times a day (BID) | ORAL | Status: AC

## 2017-11-17 MED ORDER — SERTRALINE HCL 50 MG PO TABS: 50.0000 mg | ORAL_TABLET | Freq: Every day | ORAL | Status: AC

## 2017-11-17 NOTE — Progress Notes (Signed)
STROKE TEAM PROGRESS NOTE   INTERVAL HISTORY No family members present today.  Patient up in the chair. Awake. watching TV. Her right arm is less red and edematous test today. Patient states it is still sore in her wrists but less so over all.    CBC:  CBC Latest Ref Rng & Units 11/17/2017 11/16/2017 11/13/2017  WBC 4.0 - 10.5 K/uL 11.8(H) 10.4 8.1  Hemoglobin 12.0 - 15.0 g/dL 4.0(J) 8.1(X) 9.1(Y)  Hematocrit 36.0 - 46.0 % 28.7(L) 29.0(L) 30.7(L)  Platelets 150 - 400 K/uL 235 213 215     Comprehensive Metabolic Panel:   CMP Latest Ref Rng & Units 11/17/2017 11/16/2017 11/12/2017  Glucose 65 - 99 mg/dL 782(N) 562(Z) 90  BUN 6 - 20 mg/dL 30(Q) 65(H) 84(O)  Creatinine 0.44 - 1.00 mg/dL 9.62(X) 5.28(U) 1.32(G)  Sodium 135 - 145 mmol/L 136 134(L) 141  Potassium 3.5 - 5.1 mmol/L 3.9 4.2 4.0  Chloride 101 - 111 mmol/L 107 105 107  CO2 22 - 32 mmol/L 19(L) 17(L) -  Calcium 8.9 - 10.3 mg/dL 4.0(N) 8.1(L) -  Total Protein 6.5 - 8.1 g/dL - - -  Total Bilirubin 0.3 - 1.2 mg/dL - - -  Alkaline Phos 38 - 126 U/L - - -  AST 15 - 41 U/L - - -  ALT 14 - 54 U/L - - -   Lipid Panel:     Component Value Date/Time   CHOL 192 11/13/2017 0443   TRIG 199 (H) 11/13/2017 0443   TRIG 189 (H) 11/13/2017 0443   HDL 36 (L) 11/13/2017 0443   CHOLHDL 5.3 11/13/2017 0443   VLDL 40 11/13/2017 0443   LDLCALC 116 (H) 11/13/2017 0443   HgbA1c:  Lab Results  Component Value Date   HGBA1C 7.3 (H) 11/13/2017   Urine Drug Screen:     Component Value Date/Time   LABOPIA NONE DETECTED 11/12/2017 1646   COCAINSCRNUR NONE DETECTED 11/12/2017 1646   LABBENZ NONE DETECTED 11/12/2017 1646   AMPHETMU NONE DETECTED 11/12/2017 1646   THCU NONE DETECTED 11/12/2017 1646   LABBARB NONE DETECTED 11/12/2017 1646    Alcohol Level     Component Value Date/Time   ETH <10 11/12/2017 0918    Vitals:   11/17/17 0005 11/17/17 0437 11/17/17 0821 11/17/17 1222  BP: (!) 171/78 (!) 155/62 (!) 159/63 (!) 148/66  Pulse: 84 68 71 76   Resp: 18 16 16 18   Temp: 98.4 F (36.9 C) 97.7 F (36.5 C) 98.2 F (36.8 C) 98.4 F (36.9 C)  TempSrc: Oral Oral Oral Oral  SpO2: 97% 95% 95% 97%  Weight:      Height:       Dg Chest Port 1 View  Result Date: 11/16/2017 CLINICAL DATA:  Shortness of breath EXAM: PORTABLE CHEST 1 VIEW COMPARISON:  11/15/2017 FINDINGS: There is right lower lobe airspace disease concerning for pneumonia. There is no other focal parenchymal opacity. There is no pleural effusion or pneumothorax. The heart and mediastinal contours are unremarkable. The osseous structures are unremarkable. IMPRESSION: Right lower lobe airspace disease concerning for pneumonia. Electronically Signed   By: Elige Ko   On: 11/16/2017 11:07     PHYSICAL EXAM Pleasant elderly Caucasian lady currently who is sitting up in chair, alert. Head is nontraumatic. Neck is supple without bruit.Cardiac exam no murmur or gallop. Lungs are clear to auscultation. R FA with decreased redness compared to yesterday. No longer warm to touch, some edema remains. Sore to touch at the inner  wrist area. pulses are well felt. Neurological Exam :  Awake alert oriented 2. Knows her age and that her birthday was yesterday. Significant dysarthria, difficult to understood. Follows 1 step commands. Has perseveration of speech, appears aphasic. Extraocular moments are full range without nystagmus. Pupils irregular but reactive. Fundi not visualized. Left lower facial weakness. Tongue midline. Dense left hemiplegia with 0/5 left upper extremity and 2/5 left lower extremity weakness. Sensation appears diminished on the left compared to the right. Tone is reduced on the left compared to the right. .Left plantar upgoing right downgoing. Gait not tested.   ASSESSMENT/PLAN Ms. Madison Kennedy is a 84 y.o. female with history of AF not on AC, HTN and Diabetes presenting with L facial droop and expressive aphasia. She received tPA 11/12/2017 at 0954.  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 Small vessel disease. Atrophy. ASPECTS 10.     CTA head Severe IC atherosclerosis, greatest in ACAs. Occlusion R A2.  CTA neck no sign stenosis. mod stenosis B VAs. Diffuse BA dz.   CT perfusion delayed perfusion R medial frontal and parietal lobe. No fixed infarct  MRI  R ACA infarct. Atrophy. Small vessel disease.   2D Echo - EF 35 - 40 %.  No cardiac source of emboli identified.  LDL 116  HgbA1c 7.2  Lovenox 30 mg sq daily for VTE prophylaxis Fall precautions Diet Carb Modified Fluid consistency: Thin; Room service appropriate? Yes  No antithrombotic prior to admission, now on Aspirin 325 mg daily. Plan AC at time of d/c if remains stable.  Therapy recommendations:  SNF  Disposition:  SNF (lived alone PTA in an elderly apt, fixing own meals)  Atrial Fibrillation  Home anticoagulation:  none   On asa 325 mg now  Plan AC at d/c if remains stable.  Accelerated Hypertension  Elevated on arrival, as high as 184/83   BP improved after addition of lopressor 4/8  On cardizem 180 at home, resumed in hospital . On lopressor 25 bid added with improvement in BP . Long-term BP goal normotensive  Hyperlipidemia  Home meds:  lipitor 10, resumed in hospital  LDL 116, goal < 70  Diabetes type II  HgbA1c 7.3 goal < 7.0  CBGs  SSI  Infectious process  WBC 10.4->11.8. Low grade temperature  CXR 4/7 w/ R base infiltrate, ? PNA. Repeat CXR still with possible RLL airspace dz concerning for PNA  R FA IV infiltrate, improving  Pain improving  Elevate arm and cool/warm for comfort  UTI  UA 4/7 positive nitrites, lg leukocytes, WBC TNTC  Started on Rocephin 4/7  Change rocephin to omnicef po daily for total of 7d treatment  Urine Cx pending  Other Stroke Risk Factors  Advanced age  Former Cigarette smoker  UDS /  ETOH levels - normal  Anemia, chronic disease  Hgb 9.4  CKD  BUM/CR 139/1.77  Baseline Cr 1.7    Plan / Recommendations   Stroke workup: completed  Therapy Follow Up: SNF  Disposition: SNF, maybe tomorrow depending on BP control  Antiplatelet / Anticoagulation: ASA 325 mg daily currently.  Change to eliquis 2.5 mg bid at time of d/c   Statin: Lipitor 10 mg daily  MD Follow Up: stroke clinic  Further risk factor modification per primary care MD: Follow Up 2 weeks  F/u BUN and creatinine  F/u CBC   Hospital day # 5  Annie MainSharon Romana Deaton, MSN, APRN, ANVP-BC, AGPCNP-BC Advanced Practice Stroke Nurse [facility] See Amion for Schedule & Pager information 11/17/2017 1:52 PM      To contact Stroke Continuity provider, please refer to [email or web address]. After hours, contact General Neurology

## 2017-11-17 NOTE — Progress Notes (Signed)
  Speech Language Pathology Treatment: Cognitive-Linquistic  Patient Details Name: Madison Kennedy MRN: [id number] DOB: 05/22/1920 Today's Date: 11/17/2017 Time: [id number] SLP Time Calculation (min) (ACUTE ONLY): 11 min  Assessment / Plan / Recommendation Clinical Impression  Pt up in chair leaning and head tilted to the right. She was quite echolalic today however answered several responsive naming questions accurately and independently (her name, age, "celebration"). Phrase completion and responsive naming cues not effective for desired response. She followed 1 two steps commands and 3/3 one step commands. Accurately identified red call button for nurse. Pt will need 24 hour supervision. Continue tx.       HPI HPI: Ms. Madison Kennedy is a [age] y.o. female with history of AF not on AC, HTN and DB presenting with L facial droop and expressive aphasia. She received tPA 11/12/2017 at 0954. Determined to have suffered an acute ACA infarct per MRI.        SLP Plan  Continue with current plan of care       Recommendations                   Oral Care Recommendations: Oral care BID Follow up Recommendations: Skilled Nursing facility SLP Visit Diagnosis: Aphasia (R47.01);Cognitive communication deficit (Y78.295(R41.841) Plan: Continue with current plan of care                       Royce MacadamiaLitaker, Dawon Troop Willis 11/17/2017, 2:15 PM   Breck CoonsLisa Willis Lonell FaceLitaker M.Ed [employer]-SLP Pager [phone]

## 2017-11-17 NOTE — Plan of Care (Signed)
  Problem: Spiritual Needs Goal: Ability to function at adequate level Outcome: Adequate for Discharge   Problem: Education: Goal: Knowledge of General Education information will improve Outcome: Adequate for Discharge   Problem: Health Behavior/Discharge Planning: Goal: Ability to manage health-related needs will improve Outcome: Adequate for Discharge   Problem: Activity: Goal: Risk for activity intolerance will decrease Outcome: Adequate for Discharge   Problem: Nutrition: Goal: Adequate nutrition will be maintained Outcome: Adequate for Discharge   Problem: Coping: Goal: Level of anxiety will decrease Outcome: Adequate for Discharge   Problem: Elimination: Goal: Will not experience complications related to bowel motility Outcome: Adequate for Discharge Goal: Will not experience complications related to urinary retention Outcome: Adequate for Discharge   Problem: Safety: Goal: Ability to remain free from injury will improve Outcome: Adequate for Discharge   Problem: Skin Integrity: Goal: Risk for impaired skin integrity will decrease Outcome: Adequate for Discharge

## 2017-11-17 NOTE — Care Management Note (Signed)
Case Management Note  Patient Details  Name: Madison Kennedy MRN: [id number] Date of Birth: 09/28/1919  Subjective/Objective:                    Action/Plan: [facility]. CM signing off.   Expected Discharge Date:  11/17/17               Expected Discharge Plan:  Skilled Nursing Facility  In-House Referral:  Clinical Social Work  Discharge planning Services     Post Acute Care Choice:    Choice offered to:     DME Arranged:    DME Agency:     HH Arranged:    HH Agency:     Status of Service:  Completed, signed off  If discussed at [employer] Meetings, dates discussed:    Additional Comments:  [name], RN 11/17/2017, 4:41 PM

## 2017-11-17 NOTE — Clinical Social Work Placement (Signed)
Nurse to call report to [phone], Room 203B    CLINICAL SOCIAL WORK PLACEMENT  NOTE  Date:  11/17/2017  Patient Details  Name: Madison Kennedy MRN: [id number] Date of Birth: 02/25/1920  Clinical Social Work is seeking post-discharge placement for this patient at the Skilled  Nursing Facility level of care (*CSW will initial, date and re-position this form in  chart as items are completed):  Yes   Patient/family provided with [facility] Clinical Social Work Department's list of facilities offering this level of care within the geographic area requested by the patient (or if unable, by the patient's family).  Yes   Patient/family informed of their freedom to choose among providers that offer the needed level of care, that participate in Medicare, Medicaid or managed care program needed by the patient, have an available bed and are willing to accept the patient.  Yes   Patient/family informed of [facility]'s ownership interest in [facility] and [facility], as well as of the fact that they are under no obligation to receive care at these facilities.  PASRR submitted to EDS on       PASRR number received on 11/15/17     Existing PASRR number confirmed on       FL2 transmitted to all facilities in geographic area requested by pt/family on 11/15/17     FL2 transmitted to all facilities within larger geographic area on       Patient informed that his/her managed care company has contracts with or will negotiate with certain facilities, including the following:        Yes   Patient/family informed of bed offers received.  [facility]     Physician recommends and patient chooses bed at      [facility] 11/17/17.  Patient to be transferred to facility by [facility]AR     Patient family notified on 11/17/17 of transfer.  Name of family member notified:  Arma HeadingVern, Mary     PHYSICIAN       Additional Comment:     _______________________________________________ Baldemar LenisElizabeth M Harace Mccluney, LCSW 11/17/2017, 5:31 PM

## 2017-11-20 ENCOUNTER — Other Ambulatory Visit: Payer: Self-pay

## 2017-11-20 ENCOUNTER — Emergency Department
Admission: EM | Admit: 2017-11-20 | Discharge: 2017-11-20 | Disposition: A | Discharge status: Skilled Nursing Facility | Payer: Medicare Other | Attending: Emergency Medicine | Admitting: Emergency Medicine

## 2017-11-20 ENCOUNTER — Emergency Department: Payer: Medicare Other

## 2017-11-20 ENCOUNTER — Encounter: Payer: Self-pay | Admitting: Emergency Medicine

## 2017-11-20 DIAGNOSIS — Z87891 Personal history of nicotine dependence: Secondary | ICD-10-CM | POA: Diagnosis not present

## 2017-11-20 DIAGNOSIS — I10 Essential (primary) hypertension: Secondary | ICD-10-CM | POA: Diagnosis not present

## 2017-11-20 DIAGNOSIS — K5900 Constipation, unspecified: Secondary | ICD-10-CM | POA: Diagnosis not present

## 2017-11-20 DIAGNOSIS — E119 Type 2 diabetes mellitus without complications: Secondary | ICD-10-CM | POA: Insufficient documentation

## 2017-11-20 DIAGNOSIS — Z7901 Long term (current) use of anticoagulants: Secondary | ICD-10-CM | POA: Insufficient documentation

## 2017-11-20 DIAGNOSIS — Z79899 Other long term (current) drug therapy: Secondary | ICD-10-CM | POA: Insufficient documentation

## 2017-11-20 DIAGNOSIS — Z7984 Long term (current) use of oral hypoglycemic drugs: Secondary | ICD-10-CM | POA: Insufficient documentation

## 2017-11-20 DIAGNOSIS — R339 Retention of urine, unspecified: Secondary | ICD-10-CM | POA: Diagnosis present

## 2017-11-20 HISTORY — DX: Cerebral infarction, unspecified: I63.9

## 2017-11-20 LAB — BASIC METABOLIC PANEL
Anion gap: 9 (ref 5–15)
BUN: 62 mg/dL — AB (ref 6–20)
CHLORIDE: 105 mmol/L (ref 101–111)
CO2: 20 mmol/L — ABNORMAL LOW (ref 22–32)
CREATININE: 2.21 mg/dL — AB (ref 0.44–1.00)
Calcium: 8.4 mg/dL — ABNORMAL LOW (ref 8.9–10.3)
GFR calc Af Amer: 20 mL/min — ABNORMAL LOW (ref >60)
GFR calc non Af Amer: 18 mL/min — ABNORMAL LOW (ref >60)
GLUCOSE: 169 mg/dL — AB (ref 65–99)
POTASSIUM: 4.3 mmol/L (ref 3.5–5.1)
SODIUM: 134 mmol/L — AB (ref 135–145)

## 2017-11-20 LAB — CBC WITH DIFFERENTIAL/PLATELET
Basophils Absolute: 0.1 10*3/uL (ref 0–0.1)
Basophils Relative: 1 %
EOS ABS: 0.1 10*3/uL (ref 0–0.7)
Eosinophils Relative: 1 %
HCT: 32.7 % — ABNORMAL LOW (ref 35.0–47.0)
Hemoglobin: 11 g/dL — ABNORMAL LOW (ref 12.0–16.0)
LYMPHS ABS: 1.3 10*3/uL (ref 1.0–3.6)
LYMPHS PCT: 9 %
MCH: 31.3 pg (ref 26.0–34.0)
MCHC: 33.7 g/dL (ref 32.0–36.0)
MCV: 92.9 fL (ref 80.0–100.0)
MONOS PCT: 8 %
Monocytes Absolute: 1.3 10*3/uL — ABNORMAL HIGH (ref 0.2–0.9)
Neutro Abs: 12.6 10*3/uL — ABNORMAL HIGH (ref 1.4–6.5)
Neutrophils Relative %: 81 %
Platelets: 446 10*3/uL — ABNORMAL HIGH (ref 150–440)
RBC: 3.52 MIL/uL — AB (ref 3.80–5.20)
RDW: 15.6 % — ABNORMAL HIGH (ref 11.5–14.5)
WBC: 15.3 10*3/uL — AB (ref 3.6–11.0)

## 2017-11-20 LAB — URINALYSIS, COMPLETE (UACMP) WITH MICROSCOPIC
Bacteria, UA: NONE SEEN
Bilirubin Urine: NEGATIVE
Glucose, UA: 50 mg/dL — AB
Ketones, ur: NEGATIVE mg/dL
Nitrite: NEGATIVE
PH: 5 (ref 5.0–8.0)
Protein, ur: 30 mg/dL — AB
SPECIFIC GRAVITY, URINE: 1.018 (ref 1.005–1.030)

## 2017-11-20 MED ORDER — SODIUM CHLORIDE 0.9 % IV BOLUS
500.0000 mL | Freq: Once | INTRAVENOUS | Status: AC
  Administered 2017-11-20: 500 mL via INTRAVENOUS

## 2017-11-20 NOTE — ED Notes (Addendum)
SL removed per MD as requested by pt's daughter at bedside; verified with pt's daughter that she only wants foley cath to remain in place and no further treatments to be performed at this time

## 2017-11-20 NOTE — ED Notes (Signed)
Spoke with [facility] at [facility] [place] who st pt may come back and will initate social worker consult in the morning for requested palliative care; Dr Dolores FrameSung notified and report given to nurse

## 2017-11-20 NOTE — ED Triage Notes (Signed)
Pt to room 3 via EMS [facility] for reports of urinary retention x 1 day; >93299ml via bladder scan by staff; pt is alert but does not answer questions, only repeats questions asked; recent CVA

## 2017-11-20 NOTE — ED Notes (Signed)
Daughter at bedside; updated on plan of care & Dr Dolores FrameSung notified

## 2017-11-20 NOTE — Discharge Instructions (Signed)
Patient's family is interested in palliative care/hospice.  Please have your social worker assist the patient and her family with this, thank you.

## 2017-11-20 NOTE — ED Notes (Addendum)
Pt resting quietly, eyes open; family remains at bedside; updated on disposition; cont to await EMS transport who report no trucks available at this time

## 2017-11-20 NOTE — ED Provider Notes (Signed)
[facility] Emergency Department Provider Note   ____________________________________________   First MD Initiated Contact with Patient 11/20/17 [phone]     (approximate)  I have reviewed the triage vital signs and the nursing notes.   HISTORY  Chief Complaint Urinary Retention  Level 5 caveat: History limited by decreased verbal state  HPI Madison Kennedy is a 84 y.o. female brought to the ED via EMS from SNF with a chief complaint of urinary retention.  Patient was hospitalized last week with CVA, received TPA and discharged to SNF on Eliquis as well as Omnicef for both UTI and questionable pneumonia.  Reportedly staff at SNF performed a bladder scan which revealed >961mL.  They attempted to place a Foley but was unsuccessful and thus sent the patient to the ED for Foley placement.  Rest of history is unobtainable secondary to patient's decreased verbal state which, from recent discharge summary, appears to be baseline.  Family not yet at bedside.   Past Medical History:  Diagnosis Date  . Diabetes mellitus without complication (HCC)   . Hypertension   . Stroke [facility])     Patient Active Problem List   Diagnosis Date Noted  . 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 Chronic anemia 04/30/2017  . Cataract cortical, senile 04/06/2017  . Benign essential hypertension 04/06/2017  . GERD (gastroesophageal reflux disease) 04/06/2017  . PAC (premature atrial contraction) 04/06/2017  . Degenerative lumbar spinal stenosis 09/19/2015  . Lumbar spondylosis 09/19/2015  . Primary osteoarthritis of right hip 09/19/2015  . Diabetes mellitus type 2, controlled, without complications (HCC) 11/02/2014  . Hyperlipidemia, mixed 11/02/2014  . Chronic hoarseness 05/03/2014  . Idiopathic peripheral neuropathy 05/03/2014    History reviewed. No pertinent surgical history.  Prior to Admission medications   Medication Sig Start Date End Date Taking? Authorizing Provider    Alogliptin Benzoate 12.5 MG TABS Take by mouth. 05/31/16   [provider]  apixaban (ELIQUIS) 2.5 MG TABS tablet Take 1 tablet (2.5 mg total) by mouth 2 (two) times daily. 11/18/17   Layne Benton, NP  atorvastatin (LIPITOR) 10 MG tablet Take 1 tablet (10 mg total) by mouth daily at 6 PM. 11/17/17   Layne Benton, NP  cefdinir (OMNICEF) 300 MG capsule Take 1 capsule (300 mg total) by mouth daily. 11/17/17   Layne Benton, NP  diltiazem (CARDIZEM CD) 180 MG 24 hr capsule Take 1 capsule (180 mg total) by mouth daily. 11/18/17   Layne Benton, NP  esomeprazole (NEXIUM) 40 MG capsule Take by mouth. 08/13/16   [provider]  glimepiride (AMARYL) 4 MG tablet Take 6 mg by mouth See admin instructions. Taking 1 tablet in the AM and 1/2 tablet in the afternoon 07/15/16 11/12/17  [provider]  Iron-Vitamin C (VITRON-C) 65-125 MG TABS Take by mouth.    [provider]  metoprolol tartrate (LOPRESSOR) 25 MG tablet Take 1 tablet (25 mg total) by mouth 2 (two) times daily. 11/17/17   Layne Benton, NP  Multiple Vitamins-Minerals (ICAPS PO) Take by mouth.    [provider]  NONFORMULARY OR COMPOUNDED ITEM Apply 1-2 g topically 4 (four) times daily. 09/23/16   Felecia Shelling, DPM  sertraline (ZOLOFT) 50 MG tablet Take 1 tablet (50 mg total) by mouth daily. 11/18/17   Layne Benton, NP    Allergies Ace inhibitors and Tramadol  No family history on file.  Social History Social History   Tobacco Use  . Smoking  status: Former Games developermoker  . Smokeless tobacco: Never Used  Substance Use Topics  . Alcohol use: Not on file    Comment: occasional drink  . Drug use: No    Review of Systems  Constitutional: No fever/chills. Eyes: No visual changes. ENT: No sore throat. Cardiovascular: Denies chest pain. Respiratory: Denies shortness of breath. Gastrointestinal: No abdominal pain.  No nausea, no vomiting.  No diarrhea.  No constipation. Genitourinary: Positive for  urinary retention.  Negative for dysuria. Musculoskeletal: Negative for back pain. Skin: Negative for rash. Neurological: Negative for headaches, focal weakness or numbness.   ____________________________________________   PHYSICAL EXAM:  VITAL SIGNS: ED Triage Vitals [11/20/17 0308]  Enc Vitals Group     BP (!) 162/84     Pulse Rate 70     Resp 16     Temp 98 F (36.7 C)     Temp Source Oral     SpO2 95 %     Weight      Height      Head Circumference      Peak Flow      Pain Score      Pain Loc      Pain Edu?      Excl. in GC?    Examined after Foley placement: Constitutional: Alert and oriented to person.  Frail appearing and in no acute distress. Eyes: Conjunctivae are normal. PERRL. EOMI. Head: Atraumatic. Nose: No congestion/rhinnorhea. Mouth/Throat: Mucous membranes are moist.  Oropharynx non-erythematous. Neck: No stridor.   Cardiovascular: Normal rate, regular rhythm. Grossly normal heart sounds.  Good peripheral circulation. Respiratory: Normal respiratory effort.  No retractions. Lungs CTAB. Gastrointestinal: Soft and nontender to light or deep palpation. No distention. No abdominal bruits. No CVA tenderness. Genitourinary: Indwelling Foley in place, just placed by nurse who has drained 1100 mL of urine. Musculoskeletal: No lower extremity tenderness nor edema.  No joint effusions. Neurologic: Alert and oriented to person only.  Decreased verbal state which appears to be baseline from patient's discharge summary.  Dysarthric and difficult to understand.  Left hemiplegia.  Skin:  Skin is warm, dry and intact. No rash noted. Psychiatric: Mood and affect are normal. Speech and behavior are normal.  ____________________________________________   LABS (all labs ordered are listed, but only abnormal results are displayed)  Labs Reviewed  URINALYSIS, COMPLETE (UACMP) WITH MICROSCOPIC - Abnormal; Notable for the following components:      Result Value   Color,  Urine YELLOW (*)    APPearance HAZY (*)    Glucose, UA 50 (*)    Hgb urine dipstick LARGE (*)    Protein, ur 30 (*)    Leukocytes, UA TRACE (*)    Squamous Epithelial / LPF 0-5 (*)    All other components within normal limits  CBC WITH DIFFERENTIAL/PLATELET - Abnormal; Notable for the following components:   WBC 15.3 (*)    RBC 3.52 (*)    Hemoglobin 11.0 (*)    HCT 32.7 (*)    RDW 15.6 (*)    Platelets 446 (*)    Neutro Abs 12.6 (*)    Monocytes Absolute 1.3 (*)    All other components within normal limits  BASIC METABOLIC PANEL - Abnormal; Notable for the following components:   Sodium 134 (*)    CO2 20 (*)    Glucose, Bld 169 (*)    BUN 62 (*)    Creatinine, Ser 2.21 (*)    Calcium 8.4 (*)    GFR calc  non Af Amer 18 (*)    GFR calc Af Amer 20 (*)    All other components within normal limits  URINE CULTURE   ____________________________________________  EKG  None - d/c per family request ____________________________________________  RADIOLOGY  ED MD interpretation: Moderate stool  Official radiology report(s): Dg Abdomen 1 View  Result Date: 11/20/2017 CLINICAL DATA:  Evaluate constipation.  Urinary retention. EXAM: ABDOMEN - 1 VIEW COMPARISON:  None. FINDINGS: No bowel dilatation to suggest obstruction. No evidence of free air. Moderate stool in the ascending, descending, and sigmoid colon. Stool within the rectum without abnormal distension. Multiple calcified granulomas in the spleen. There are vascular calcifications. Lower most lung bases are clear. Bones are under mineralized. IMPRESSION: Moderate colonic stool burden without fecal impaction or bowel obstruction. Electronically Signed   By: Rubye Oaks M.D.   On: 11/20/2017 04:04    ____________________________________________   PROCEDURES  Procedure(s) performed: None  Procedures  Critical Care performed: No  ____________________________________________   INITIAL IMPRESSION / ASSESSMENT AND  PLAN / ED COURSE  As part of my medical decision making, I reviewed the following data within the electronic MEDICAL RECORD NUMBER Nursing notes reviewed and incorporated, Labs reviewed, EKG interpreted, Old chart reviewed, Radiograph reviewed and Notes from prior ED visits   84 year old female sent from SNF for urinary retention.  Recent TPA for CVA; discharged on Eliquis and Omnicef for UTI and possible pneumonia.  Reviewed patient's chart which indicates on the day of hospitalization she was having issues with constipation.  Other differential diagnosis includes but is not limited to UTI, renal insufficiency/failure, metabolic, etc.  Constipation may be the leading cause of her urinary retention.  Will obtain screening lab work, urinalysis and KUB.  Clinical Course as of Nov 20 456  Fri Nov 20, 2017  1610 Patient's daughter who is her healthcare POA and her son-in-law are at bedside.  They are interested in palliative care at this point.  They do not see the point of sending patient back to rehab, and would be interested in hospice or end-of-life care.  They are requesting the IV be removed and all medications be discontinued.  We discussed risk/benefits of continuing the Foley catheter.  They wish to continue the Foley catheter to decrease patient's pain or suffering.  They reaffirm that the patient is DNR/DNI.  They acknowledge that they do not wish for patient to have any medicines, including her anticoagulant and her antibiotic.  They have also declined an offer for enema or oral lactulose.  This conversation is witnessed by patient's primary nurse Misty Stanley.  Will switch patient to a more comfortable inpatient bed.  Will place clinical social work consult for palliative care/hospice placement.   [JS]  [phone] Nurse was able to confirm with [facility] that their social worker would be able to assist the patient and her family with palliative care/hospice services.  Patient will be discharged back with  Foley catheter in place.  Family is satisfied with this arrangement.   [JS]    Clinical Course User Index [JS] Irean Hong, MD     ____________________________________________   FINAL CLINICAL IMPRESSION(S) / ED DIAGNOSES  Final diagnoses:  Urinary retention  Constipation, unspecified constipation type     ED Discharge Orders    None       Note:  This document was prepared using Dragon voice recognition software and may include unintentional dictation errors.    Irean Hong, MD 11/20/17 [phone]

## 2017-11-21 LAB — URINE CULTURE: Culture: NO GROWTH

## 2017-12-03 ENCOUNTER — Other Ambulatory Visit
Admission: RE | Admit: 2017-12-03 | Discharge: 2017-12-03 | Disposition: A | Discharge status: Home / Self Care | Payer: Medicare Other | Source: Skilled Nursing Facility | Attending: Internal Medicine | Admitting: Internal Medicine

## 2017-12-03 DIAGNOSIS — R39198 Other difficulties with micturition: Secondary | ICD-10-CM | POA: Diagnosis present

## 2017-12-03 LAB — URINALYSIS, COMPLETE (UACMP) WITH MICROSCOPIC
Bilirubin Urine: NEGATIVE
Glucose, UA: NEGATIVE mg/dL
Ketones, ur: NEGATIVE mg/dL
Nitrite: NEGATIVE
PROTEIN: 30 mg/dL — AB
RBC / HPF: >50 RBC/hpf — ABNORMAL HIGH (ref 0–5)
SPECIFIC GRAVITY, URINE: 1.017 (ref 1.005–1.030)
pH: 5 (ref 5.0–8.0)

## 2017-12-04 ENCOUNTER — Other Ambulatory Visit
Admission: RE | Admit: 2017-12-04 | Discharge: 2017-12-04 | Disposition: A | Discharge status: Home / Self Care | Payer: No Typology Code available for payment source | Source: Skilled Nursing Facility | Attending: Internal Medicine | Admitting: Internal Medicine

## 2017-12-04 DIAGNOSIS — I69354 Hemiplegia and hemiparesis following cerebral infarction affecting left non-dominant side: Secondary | ICD-10-CM | POA: Insufficient documentation

## 2017-12-04 LAB — CBC WITH DIFFERENTIAL/PLATELET
BASOS ABS: 0.1 10*3/uL (ref 0–0.1)
Basophils Relative: 1 %
EOS ABS: 0.1 10*3/uL (ref 0–0.7)
EOS PCT: 1 %
HCT: 30.8 % — ABNORMAL LOW (ref 35.0–47.0)
HEMOGLOBIN: 10.1 g/dL — AB (ref 12.0–16.0)
Lymphocytes Relative: 11 %
Lymphs Abs: 1.6 10*3/uL (ref 1.0–3.6)
MCH: 30.6 pg (ref 26.0–34.0)
MCHC: 32.7 g/dL (ref 32.0–36.0)
MCV: 93.5 fL (ref 80.0–100.0)
Monocytes Absolute: 1 10*3/uL — ABNORMAL HIGH (ref 0.2–0.9)
Monocytes Relative: 7 %
NEUTROS PCT: 80 %
Neutro Abs: 11.3 10*3/uL — ABNORMAL HIGH (ref 1.4–6.5)
PLATELETS: 501 10*3/uL — AB (ref 150–440)
RBC: 3.29 MIL/uL — AB (ref 3.80–5.20)
RDW: 15.6 % — ABNORMAL HIGH (ref 11.5–14.5)
WBC: 14.2 10*3/uL — AB (ref 3.6–11.0)

## 2017-12-04 LAB — LIPID PANEL
Cholesterol: 99 mg/dL (ref 0–200)
HDL: 25 mg/dL — ABNORMAL LOW (ref >40)
LDL CALC: 53 mg/dL (ref 0–99)
TRIGLYCERIDES: 103 mg/dL (ref <150)
Total CHOL/HDL Ratio: 4 RATIO
VLDL: 21 mg/dL (ref 0–40)

## 2017-12-04 LAB — COMPREHENSIVE METABOLIC PANEL
ALBUMIN: 2 g/dL — AB (ref 3.5–5.0)
ALK PHOS: 155 U/L — AB (ref 38–126)
ALT: 13 U/L — AB (ref 14–54)
AST: 25 U/L (ref 15–41)
Anion gap: 9 (ref 5–15)
BUN: 47 mg/dL — AB (ref 6–20)
CHLORIDE: 102 mmol/L (ref 101–111)
CO2: 24 mmol/L (ref 22–32)
CREATININE: 1.43 mg/dL — AB (ref 0.44–1.00)
Calcium: 8 mg/dL — ABNORMAL LOW (ref 8.9–10.3)
GFR calc Af Amer: 35 mL/min — ABNORMAL LOW (ref >60)
GFR calc non Af Amer: 30 mL/min — ABNORMAL LOW (ref >60)
GLUCOSE: 29 mg/dL — AB (ref 65–99)
Potassium: 3.7 mmol/L (ref 3.5–5.1)
SODIUM: 135 mmol/L (ref 135–145)
Total Bilirubin: 0.6 mg/dL (ref 0.3–1.2)
Total Protein: 5.2 g/dL — ABNORMAL LOW (ref 6.5–8.1)

## 2017-12-04 LAB — MAGNESIUM: MAGNESIUM: 1.6 mg/dL — AB (ref 1.7–2.4)

## 2017-12-04 LAB — VITAMIN B12: VITAMIN B 12: 608 pg/mL (ref 180–914)

## 2017-12-04 LAB — TSH: TSH: 7.144 u[IU]/mL — ABNORMAL HIGH (ref 0.350–4.500)

## 2017-12-05 LAB — VITAMIN D 25 HYDROXY (VIT D DEFICIENCY, FRACTURES): VIT D 25 HYDROXY: 30 ng/mL (ref 30.0–100.0)

## 2017-12-06 LAB — URINE CULTURE: Culture: >=100000 — AB

## 2017-12-09 ENCOUNTER — Encounter
Admission: RE | Admit: 2017-12-09 | Discharge: 2017-12-09 | Disposition: A | Discharge status: Home / Self Care | Payer: Medicare Other | Source: Ambulatory Visit | Attending: Internal Medicine | Admitting: Internal Medicine

## 2017-12-11 ENCOUNTER — Encounter: Payer: Self-pay | Admitting: Gerontology

## 2017-12-11 ENCOUNTER — Non-Acute Institutional Stay (SKILLED_NURSING_FACILITY): Payer: Medicare Other | Admitting: Gerontology

## 2017-12-11 DIAGNOSIS — E119 Type 2 diabetes mellitus without complications: Secondary | ICD-10-CM | POA: Diagnosis not present

## 2017-12-11 DIAGNOSIS — R339 Retention of urine, unspecified: Secondary | ICD-10-CM | POA: Diagnosis not present

## 2017-12-11 DIAGNOSIS — N3001 Acute cystitis with hematuria: Secondary | ICD-10-CM

## 2017-12-11 DIAGNOSIS — I63421 Cerebral infarction due to embolism of right anterior cerebral artery: Secondary | ICD-10-CM | POA: Diagnosis not present

## 2017-12-11 NOTE — Progress Notes (Signed)
Location:   [facility] Room Number: 203B Place of Service:  SNF [id number]) Provider:  Toni Arthurs, NP-C  Blair Dolphin, MD  Patient Care Team: Blair Dolphin, MD as PCP - General (Pediatrics)  Extended Emergency Contact Information Primary Emergency Contact: Barnabas Lister Address: [place], [place] [address] Johnnette Litter of [place] Phone: [phone] Work Phone: [phone] Mobile Phone: [phone] Relation: Daughter Secondary Emergency Contact: Clayton Bibles Mobile Phone: [phone] Relation: Other  Code Status:  DNR Goals of care: Advanced Directive information Advanced Directives 11/20/2017  Does Patient Have a Medical Advance Directive? Yes  Type of Advance Directive Out of facility DNR (pink MOST or yellow form)  Does patient want to make changes to medical advance directive? No - Patient declined  Copy of [place] in Chart? -  Pre-existing out of facility DNR order (yellow form or pink MOST form) Yellow form placed in chart (order not valid for inpatient use)     Chief Complaint  Patient presents with  . Medical Management of Chronic Issues    Routine Visit    HPI:  Pt is a 84 y.o. female seen today for medical management of chronic diseases. Pt ws admitted to the facility for rehab following hospitalization for acute CVA. Pt was treated with tPA for the non-hemorrhagic stroke. Pt has not been able to fully participate in PT/OT due to deficits and altered cognition. Pt was also found, while at the facility, to have a UTI with both Pseudomonas and Enterococcus Faecalis. She had been taking Levaquin po for this. Pt is confused with expressive aphasia. She is scheduled to transition to LTC when bed is available at facility of choice. Pt has been having multiple readings of low blood glucose in the am. PO intake is reduced. Pt has also been unable void/ having urinary retention. Foley in place  for bladder decompression. No indication of pain. VSS.  Please note pt with limited verbal/cognitive ability. Unable to obtain complete ROS. Some ROS info obtained from staff and documentation.       Past Medical History:  Diagnosis Date  . Cataract cortical, senile   . Controlled diabetes mellitus type II without complication ([place])    without mention of complication, not stated as uncontrolled  . Diabetes mellitus without complication ([place])   . Essential hypertension, benign   . GERD (gastroesophageal reflux disease)   . Hyperlipidemia    other and unspecified  . Hypertension   . PAC (premature atrial contraction)   . Stroke [facility])    Past Surgical History:  Procedure Laterality Date  . APPENDECTOMY    . CHOLECYSTECTOMY    . FRACTURE SURGERY     left ankle fracture  . TONSILLECTOMY      Allergies  Allergen Reactions  . Ace Inhibitors Other (See Comments)  . Tramadol Other (See Comments)    drowsiness    Allergies as of 12/11/2017      Reactions   Ace Inhibitors Other (See Comments)   Tramadol Other (See Comments)   drowsiness      Medication List        Accurate as of 12/11/17  2:20 PM. Always use your most recent med list.          acetaminophen 325 MG tablet Commonly known as:  TYLENOL Take 650 mg by mouth every 4 (four) hours as needed. for pain/ increased temp. May be administered  orally, per G-tube if needed or rectally if unable to swallow (separate order). Maximum dose for 24 hours is 3,000 mg from all sources of Acetaminophen/ Tylenol   Alogliptin Benzoate 12.5 MG Tabs Take 12.5 mg by mouth daily.   apixaban 2.5 MG Tabs tablet Commonly known as:  ELIQUIS Take 1 tablet (2.5 mg total) by mouth 2 (two) times daily.   atorvastatin 10 MG tablet Commonly known as:  LIPITOR Take 1 tablet (10 mg total) by mouth daily at 6 PM.   diltiazem 180 MG 24 hr capsule Commonly known as:  CARDIZEM CD Take 1 capsule (180 mg total) by mouth daily.   esomeprazole  40 MG capsule Commonly known as:  NEXIUM Take 40 mg by mouth daily.   glimepiride 4 MG tablet Commonly known as:  AMARYL Take 6 mg by mouth for diabetic treatment.See Instruction: Take 1 tablet in the morning. Take 0.5 tablet in the afternoon for diabetes   ICAPS PO Take 1 tablet by mouth daily.   insulin regular 100 units/mL injection Commonly known as:  NOVOLIN R,HUMULIN R Inject 0-12 Units into the skin 3 (three) times daily before meals. Per Sliding Scale; If Blood Sugar is 176 to 250, give 3 Units.If Blood Sugar is 251 to 325, give 6 Units.If Blood Sugar is 326 to 450, give 10 Units.If Blood Sugar is greater than 450, give 12 Units.injection and recheck in one hour. If 451 on two consecutive occasions call MD.   levofloxacin 750 MG tablet Commonly known as:  LEVAQUIN Take 750 mg by mouth every other day.   metoprolol tartrate 25 MG tablet Commonly known as:  LOPRESSOR Take 1 tablet (25 mg total) by mouth 2 (two) times daily.   sertraline 50 MG tablet Commonly known as:  ZOLOFT Take 1 tablet (50 mg total) by mouth daily.   VITRON-C 65-125 MG Tabs Generic drug:  Iron-Vitamin C Take 1 tablet by mouth daily.       Review of Systems  Unable to perform ROS: Patient nonverbal  Constitutional: Negative for activity change, appetite change, chills, diaphoresis and fever.  HENT: Negative for congestion, mouth sores, nosebleeds, postnasal drip, sneezing, sore throat, trouble swallowing and voice change.   Respiratory: Negative for apnea, cough, choking, chest tightness, shortness of breath and wheezing.   Cardiovascular: Negative for chest pain, palpitations and leg swelling.  Gastrointestinal: Negative for abdominal distention, abdominal pain, constipation, diarrhea and nausea.  Genitourinary: Positive for difficulty urinating. Negative for dysuria, frequency and urgency.  Musculoskeletal: Positive for gait problem. Negative for back pain and myalgias. Arthralgias: typical  arthritis.  Skin: Negative for color change, pallor, rash and wound.  Neurological: Positive for speech difficulty and weakness. Negative for dizziness, tremors, syncope, numbness and headaches.  Psychiatric/Behavioral: Negative for agitation and behavioral problems.  All other systems reviewed and are negative.   Immunization History  Administered Date(s) Administered  . Influenza Split 05/02/2015  . Influenza, High Dose Seasonal PF 07/03/2017  . Influenza-Unspecified 06/07/2013, 05/03/2014, 05/02/2015, 05/08/2016  . Pneumococcal Conjugate-13 10/23/2016, 07/03/2017  . Pneumococcal Polysaccharide-23 12/02/2012  . Zoster 12/02/2011   Pertinent  Health Maintenance Due  Topic Date Due  . FOOT EXAM  [birth year]31  . OPHTHALMOLOGY EXAM  11/17/[birth year]31  . URINE MICROALBUMIN  11/17/1931  . DEXA SCAN  11/17/1986  . INFLUENZA VACCINE  03/11/2018  . HEMOGLOBIN A1C  05/15/2018  . PNA vac Low Risk Adult  Completed   No flowsheet data found. Functional Status Survey:    Vitals:   12/11/17  1337  BP: (!) 137/56  Pulse: 69  Resp: 18  Temp: 97.9 F (36.6 C)  TempSrc: Oral  SpO2: 96%  Weight: 145 lb 11.2 oz (66.1 kg)  Height: _0  (1.651 m)   Body mass index is 24.25 kg/m. Physical Exam  Constitutional: Vital signs are normal. She appears well-developed and well-nourished. She does not appear ill. No distress.  HENT:  Head: Normocephalic and atraumatic.  Mouth/Throat: Uvula is midline, oropharynx is clear and moist and mucous membranes are normal. Mucous membranes are not pale, not dry and not cyanotic.  Eyes: Pupils are equal, round, and reactive to light. Conjunctivae, EOM and lids are normal.  Neck: Trachea normal, normal range of motion and full passive range of motion without pain. Neck supple. No JVD present. No tracheal deviation, no edema and no erythema present. No thyromegaly present.  Cardiovascular: Normal rate, regular rhythm, normal heart sounds, intact distal pulses and  normal pulses. Exam reveals no gallop, no distant heart sounds and no friction rub.  No murmur heard. Pulses:      Dorsalis pedis pulses are 2+ on the right side, and 2+ on the left side.  No edema  Pulmonary/Chest: Effort normal and breath sounds normal. No accessory muscle usage. No respiratory distress. She has no decreased breath sounds. She has no wheezes. She has no rhonchi. She has no rales. She exhibits no tenderness.  Abdominal: Soft. Normal appearance and bowel sounds are normal. She exhibits no distension and no ascites. There is no tenderness.  Musculoskeletal: Normal range of motion. She exhibits no edema or tenderness.  Expected osteoarthritis, stiffness; Bilateral Calves soft, supple. Negative Homan's Sign. B- pedal pulses equal; generalized weakness  Neurological: She is alert. She is disoriented. She displays atrophy. A cranial nerve deficit and sensory deficit is present. She exhibits abnormal muscle tone. Coordination and gait abnormal.  Skin: Skin is warm, dry and intact. She is not diaphoretic. No cyanosis. No pallor. Nails show no clubbing.  Psychiatric: Judgment and thought content normal. Her affect is blunt. Her speech is delayed. She is slowed and withdrawn. Cognition and memory are impaired. She is noncommunicative. She exhibits abnormal recent memory and abnormal remote memory. She is inattentive.  Nursing note and vitals reviewed.   Labs reviewed: Recent Labs    11/17/17 0553 11/20/17 0329 12/04/17 0530  NA 136 134* 135  K 3.9 4.3 3.7  CL 107 105 102  CO2 19* 20* 24  GLUCOSE 141* 169* 29*  BUN 39* 62* 47*  CREATININE 1.77* 2.21* 1.43*  CALCIUM 8.4* 8.4* 8.0*  MG  --   --  1.6*   Recent Labs    11/12/17 0918 12/04/17 0530  AST 24 25  ALT 15 13*  ALKPHOS 86 155*  BILITOT 0.7 0.6  PROT 6.0* 5.2*  ALBUMIN 3.2* 2.0*   Recent Labs    11/12/17 0918  11/17/17 0553 11/20/17 0329 12/04/17 0530  WBC 13.0*   < > 11.8* 15.3* 14.2*  NEUTROABS 10.2*  --    --  12.6* 11.3*  HGB 11.1*   < > 9.4* 11.0* 10.1*  HCT 33.3*   < > 28.7* 32.7* 30.8*  MCV 93.8   < > 93.2 92.9 93.5  PLT 252   < > 235 446* 501*   < > = values in this interval not displayed.   Lab Results  Component Value Date   TSH 7.144 (H) 12/04/2017   Lab Results  Component Value Date   HGBA1C 7.3 (H) 11/13/2017  Lab Results  Component Value Date   CHOL 99 12/04/2017   HDL 25 (L) 12/04/2017   LDLCALC 53 12/04/2017   TRIG 103 12/04/2017   CHOLHDL 4.0 12/04/2017    Significant Diagnostic Results in last 30 days:  Ct Angio Head W Or Wo Contrast  Result Date: 11/12/2017 CLINICAL DATA:  Focal neuro deficit, left-sided weakness EXAM: CT ANGIOGRAPHY HEAD AND NECK CT PERFUSION BRAIN TECHNIQUE: Multidetector CT imaging of the head and neck was performed using the standard protocol during bolus administration of intravenous contrast. Multiplanar CT image reconstructions and MIPs were obtained to evaluate the vascular anatomy. Carotid stenosis measurements (when applicable) are obtained utilizing NASCET criteria, using the distal internal carotid diameter as the denominator. Multiphase CT imaging of the brain was performed following IV bolus contrast injection. Subsequent parametric perfusion maps were calculated using RAPID software. CONTRAST:  64m ISOVUE-370 IOPAMIDOL (ISOVUE-370) INJECTION 76% COMPARISON:  CT head 11/12/2017 FINDINGS: CTA NECK FINDINGS Aortic arch: Atherosclerotic calcification aortic arch. Negative for aneurysm or dissection. Proximal great vessels widely patent Right carotid system: Mild atherosclerotic disease at the right carotid bifurcation without stenosis. Left carotid system: Mild atherosclerotic calcification left carotid bifurcation without stenosis Vertebral arteries: Both vertebral arteries patent to the basilar. Moderate stenosis distal vertebral artery bilaterally due to atherosclerotic disease. Skeleton: Cervical spondylosis and degenerative change. No acute  skeletal abnormality. Other neck: Negative for mass or edema.  No adenopathy. Upper chest: Lung apices are clear. Mild apical scarring bilaterally. Review of the MIP images confirms the above findings CTA HEAD FINDINGS Anterior circulation: Atherosclerotic calcification in the cavernous carotid bilaterally without stenosis. A1 segment patent bilaterally. Occlusion of the right A2 segment. Severe disease in the left A3 branches. Middle cerebral artery patent bilaterally without occlusion. Moderate atherosclerotic disease in the middle cerebral artery branches bilaterally Posterior circulation: Moderate stenosis distal vertebral bilaterally. Basilar is diffusely diseased with multiple areas of moderate stenosis. PICA patent bilaterally. AICA patent bilaterally. 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 Mild disease in the posterior cerebral arteries bilaterally. Venous sinuses: Negative Anatomic variants: Negative for aneurysm. Delayed phase: Not perform Review of the MIP images confirms the above findings CT Brain Perfusion Findings: CBF (<30%) Volume: 060mPerfusion (Tmax>6.0s) volume: 55m40mT-max greater than 4 seconds equal 17 mL. Mismatch Volume: 55mL73mfarction Location:Right medial parietal lobe IMPRESSION: No significant carotid or vertebral artery stenosis in the neck. Moderate stenosis distal vertebral artery bilaterally. Diffuse disease in the basilar Severe intracranial atherosclerotic disease most notably in the anterior cerebral arteries bilaterally. Occlusion of the right A2 segment and diffuse disease in the left anterior cerebral artery. Middle cerebral artery branches are diffusely diseased. CT perfusion demonstrates mild delayed perfusion right medial frontal and parietal lobe, possibly anterior cerebral artery distribution. Right A2 occlusion could be acute based on this finding. No fixed infarction. Electronically Signed   By: CharFranchot GalloD.   On: 11/12/2017 10:28   Dg Abdomen 1 View  Result Date: 11/20/2017 CLINICAL DATA:  Evaluate constipation.  Urinary retention. EXAM: ABDOMEN - 1 VIEW COMPARISON:  None. FINDINGS: No bowel dilatation to suggest obstruction. No evidence of free air. Moderate stool in the ascending, descending, and sigmoid colon. Stool within the rectum without abnormal distension. Multiple calcified granulomas in the spleen. There are vascular calcifications. Lower most lung bases are clear. Bones are under mineralized. IMPRESSION: Moderate colonic stool burden without fecal impaction or bowel obstruction. Electronically Signed   By: MelaFonnie Birkenhead  On: 11/20/2017 04:04   Ct Angio Neck W Or Wo Contrast  Result Date: 11/12/2017 CLINICAL DATA:  Focal neuro deficit, left-sided weakness EXAM: CT ANGIOGRAPHY HEAD AND NECK CT PERFUSION BRAIN TECHNIQUE: Multidetector CT imaging of the head and neck was performed using the standard protocol during bolus administration of intravenous contrast. Multiplanar CT image reconstructions and MIPs were obtained to evaluate the vascular anatomy. Carotid stenosis measurements (when applicable) are obtained utilizing NASCET criteria, using the distal internal carotid diameter as the denominator. Multiphase CT imaging of the brain was performed following IV bolus contrast injection. Subsequent parametric perfusion maps were calculated using RAPID software. CONTRAST:  34m ISOVUE-370 IOPAMIDOL (ISOVUE-370) INJECTION 76% COMPARISON:  CT head 11/12/2017 FINDINGS: CTA NECK FINDINGS Aortic arch: Atherosclerotic calcification aortic arch. Negative for aneurysm or dissection. Proximal great vessels widely patent Right carotid system: Mild atherosclerotic disease at the right carotid bifurcation without stenosis. Left carotid system: Mild atherosclerotic calcification left carotid bifurcation without stenosis Vertebral arteries: Both vertebral arteries patent to the basilar. Moderate stenosis  distal vertebral artery bilaterally due to atherosclerotic disease. Skeleton: Cervical spondylosis and degenerative change. No acute skeletal abnormality. Other neck: Negative for mass or edema.  No adenopathy. Upper chest: Lung apices are clear. Mild apical scarring bilaterally. Review of the MIP images confirms the above findings CTA HEAD FINDINGS Anterior circulation: Atherosclerotic calcification in the cavernous carotid bilaterally without stenosis. A1 segment patent bilaterally. Occlusion of the right A2 segment. Severe disease in the left A3 branches. Middle cerebral artery patent bilaterally without occlusion. Moderate atherosclerotic disease in the middle cerebral artery branches bilaterally Posterior circulation: Moderate stenosis distal vertebral bilaterally. Basilar is diffusely diseased with multiple areas of moderate stenosis. PICA patent bilaterally. AICA patent bilaterally. 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 Mild disease in the posterior cerebral arteries bilaterally. Venous sinuses: Negative Anatomic variants: Negative for aneurysm. Delayed phase: Not perform Review of the MIP images confirms the above findings CT Brain Perfusion Findings: CBF (<30%) Volume: 030mPerfusion (Tmax>6.0s) volume: 74m40mT-max greater than 4 seconds equal 17 mL. Mismatch Volume: 74mL64mfarction Location:Right medial parietal lobe IMPRESSION: No significant carotid or vertebral artery stenosis in the neck. Moderate stenosis distal vertebral artery bilaterally. Diffuse disease in the basilar Severe intracranial atherosclerotic disease most notably in the anterior cerebral arteries bilaterally. Occlusion of the right A2 segment and diffuse disease in the left anterior cerebral artery. Middle cerebral artery branches are diffusely diseased. CT perfusion demonstrates mild delayed perfusion right medial frontal and parietal lobe, possibly anterior cerebral artery  distribution. Right A2 occlusion could be acute based on this finding. No fixed infarction. Electronically Signed   By: CharFranchot Gallo.   On: 11/12/2017 10:28   Mr Brain Wo Contrast  Result Date: 11/13/2017 CLINICAL DATA:  Left-sided weakness and facial droop EXAM: MRI HEAD WITHOUT CONTRAST TECHNIQUE: Multiplanar, multiecho pulse sequences of the brain and surrounding structures were obtained without intravenous contrast. COMPARISON:  Head CT 11/12/2017 FINDINGS: BRAIN: The midline structures are normal. There is abnormal diffusion restriction within the right anterior cerebral artery territory. No mass lesion, hydrocephalus, dural abnormality or extra-axial collection. Multifocal white matter hyperintensity, most commonly due to chronic ischemic microangiopathy. Generalized atrophy without lobar predilection. Old right basal ganglia lacunar infarct. No chronic microhemorrhage or superficial siderosis. VASCULAR: Major intracranial arterial and venous sinus flow voids are preserved. SKULL AND UPPER CERVICAL SPINE: The visualized skull base, calvarium, upper cervical spine and extracranial soft tissues are normal. SINUSES/ORBITS: No fluid  levels or advanced mucosal thickening. No mastoid or middle ear effusion. Normal orbits. IMPRESSION: 1. Nonhemorrhagic acute infarct of the right anterior cerebral artery territory. No mass effect. 2. Generalized atrophy and sequelae of chronic ischemic microangiopathy. Electronically Signed   By: Ulyses Jarred M.D.   On: 11/13/2017 04:18   Ct Cerebral Perfusion W Contrast  Result Date: 11/12/2017 CLINICAL DATA:  Focal neuro deficit, left-sided weakness EXAM: CT ANGIOGRAPHY HEAD AND NECK CT PERFUSION BRAIN TECHNIQUE: Multidetector CT imaging of the head and neck was performed using the standard protocol during bolus administration of intravenous contrast. Multiplanar CT image reconstructions and MIPs were obtained to evaluate the vascular anatomy. Carotid stenosis  measurements (when applicable) are obtained utilizing NASCET criteria, using the distal internal carotid diameter as the denominator. Multiphase CT imaging of the brain was performed following IV bolus contrast injection. Subsequent parametric perfusion maps were calculated using RAPID software. CONTRAST:  18m ISOVUE-370 IOPAMIDOL (ISOVUE-370) INJECTION 76% COMPARISON:  CT head 11/12/2017 FINDINGS: CTA NECK FINDINGS Aortic arch: Atherosclerotic calcification aortic arch. Negative for aneurysm or dissection. Proximal great vessels widely patent Right carotid system: Mild atherosclerotic disease at the right carotid bifurcation without stenosis. Left carotid system: Mild atherosclerotic calcification left carotid bifurcation without stenosis Vertebral arteries: Both vertebral arteries patent to the basilar. Moderate stenosis distal vertebral artery bilaterally due to atherosclerotic disease. Skeleton: Cervical spondylosis and degenerative change. No acute skeletal abnormality. Other neck: Negative for mass or edema.  No adenopathy. Upper chest: Lung apices are clear. Mild apical scarring bilaterally. Review of the MIP images confirms the above findings CTA HEAD FINDINGS Anterior circulation: Atherosclerotic calcification in the cavernous carotid bilaterally without stenosis. A1 segment patent bilaterally. Occlusion of the right A2 segment. Severe disease in the left A3 branches. Middle cerebral artery patent bilaterally without occlusion. Moderate atherosclerotic disease in the middle cerebral artery branches bilaterally Posterior circulation: Moderate stenosis distal vertebral bilaterally. Basilar is diffusely diseased with multiple areas of moderate stenosis. PICA patent bilaterally. AICA patent bilaterally. 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 Mild disease in the posterior cerebral arteries bilaterally. Venous sinuses: Negative Anatomic variants:  Negative for aneurysm. Delayed phase: Not perform Review of the MIP images confirms the above findings CT Brain Perfusion Findings: CBF (<30%) Volume: 042mPerfusion (Tmax>6.0s) volume: 96m67mT-max greater than 4 seconds equal 17 mL. Mismatch Volume: 96mL82mfarction Location:Right medial parietal lobe IMPRESSION: No significant carotid or vertebral artery stenosis in the neck. Moderate stenosis distal vertebral artery bilaterally. Diffuse disease in the basilar Severe intracranial atherosclerotic disease most notably in the anterior cerebral arteries bilaterally. Occlusion of the right A2 segment and diffuse disease in the left anterior cerebral artery. Middle cerebral artery branches are diffusely diseased. CT perfusion demonstrates mild delayed perfusion right medial frontal and parietal lobe, possibly anterior cerebral artery distribution. Right A2 occlusion could be acute based on this finding. No fixed infarction. Electronically Signed   By: CharFranchot Gallo.   On: 11/12/2017 10:28   Dg Chest Port 1 View  Result Date: 11/16/2017 CLINICAL DATA:  Shortness of breath EXAM: PORTABLE CHEST 1 VIEW COMPARISON:  11/15/2017 FINDINGS: There is right lower lobe airspace disease concerning for pneumonia. There is no other focal parenchymal opacity. There is no pleural effusion or pneumothorax. The heart and mediastinal contours are unremarkable. The osseous structures are unremarkable. IMPRESSION: Right lower lobe airspace disease concerning for pneumonia. Electronically Signed   By: HetaKathreen Devoidn: 11/16/2017 11:07   Dg  Chest Port 1 View  Result Date: 11/15/2017 CLINICAL DATA:  Fever. EXAM: PORTABLE CHEST 1 VIEW COMPARISON:  None. FINDINGS: Mild patchy opacity in the right base. No pneumothorax. No nodules or masses. No other focal infiltrates. Cardiomegaly. The hila and mediastinum are normal. IMPRESSION: Patchy opacity in the right base is worrisome for mild pneumonia given history. Recommend follow-up to  resolution. Electronically Signed   By: Dorise Bullion III M.D   On: 11/15/2017 15:39   Ct Head Code Stroke Wo Contrast  Result Date: 11/12/2017 CLINICAL DATA:  Code stroke.  Left-sided weakness and facial droop. EXAM: CT HEAD WITHOUT CONTRAST TECHNIQUE: Contiguous axial images were obtained from the base of the skull through the vertex without intravenous contrast. COMPARISON:  None. FINDINGS: Brain: Moderate atrophy without hydrocephalus. Microvascular ischemia throughout the white matter bilaterally. Chronic lacunar infarct or cyst in the left basal ganglia. Hypodensity left internal capsule most likely due to chronic ischemia. No definite acute infarct.  Negative for hemorrhage or mass. Vascular: Negative for hyperdense vessel Skull: Negative Sinuses/Orbits: Mild mucosal edema paranasal sinuses. Bilateral cataract removal. Other: None ASPECTS ([place] [facility]roke [facility] Early CT Score) - Ganglionic level infarction (caudate, lentiform nuclei, internal capsule, insula, M1-M3 cortex): 7 - Supraganglionic infarction (M4-M6 cortex): 3 Total score (0-10 with 10 being normal): 10 IMPRESSION: 1. No acute intracranial abnormality 2. ASPECTS is 10 3. Atrophy and chronic appearing ischemic changes in the white matter and left basal ganglia. 4. These results were called by telephone at the time of interpretation on 11/12/2017 at 9:33 am to Dr. Leonel Ramsay, who verbally acknowledged these results. Electronically Signed   By: Franchot Gallo M.D.   On: 11/12/2017 09:34    Assessment/Plan  Cerebrovascular accident (CVA) due to embolism of right anterior cerebral artery ([place])  Controlled type 2 diabetes mellitus without complication, without long-term current use of insulin ([place])  Acute cystitis with hematuria  Urinary retention   Continue PT/OT as able  Continue foley for urinary retention  Continue Levaquin 750 mg po EOD until course complete for UTI  Continue Eliquis 2.5 mg po BID for CVA  Decrease Amaryl  to 4 mg po Q Day  Continue to monitor FSBS AC/HS   Assist with ADLs  Safety precautions  Fall precautions  Pt to continue to be followed by Palliative Care  Likely transition to Hospice upon transfer to LTC  Follow up with PCP after D/C   Family/ staff Communication:   Total Time:  Documentation:  Face to Face:  Family/Phone:   Labs/tests ordered:  Cbc, met c next week  Medication list reviewed and assessed for continued appropriateness. Monthly medication orders reviewed and signed.  Vikki Ports, NP-C Geriatrics [facility] [phone], [place] [address] Cell Phone (Mon-Fri 8am-5pm):  [phone] On Call:  [phone] & follow prompts after 5pm & weekends Office Phone:  [phone] Office Fax:  [phone]

## 2017-12-12 DIAGNOSIS — R339 Retention of urine, unspecified: Secondary | ICD-10-CM | POA: Insufficient documentation

## 2017-12-14 ENCOUNTER — Non-Acute Institutional Stay (SKILLED_NURSING_FACILITY): Payer: Medicare Other | Admitting: Gerontology

## 2017-12-14 DIAGNOSIS — I63421 Cerebral infarction due to embolism of right anterior cerebral artery: Secondary | ICD-10-CM | POA: Diagnosis not present

## 2017-12-14 DIAGNOSIS — L308 Other specified dermatitis: Secondary | ICD-10-CM

## 2017-12-14 DIAGNOSIS — K5902 Outlet dysfunction constipation: Secondary | ICD-10-CM | POA: Insufficient documentation

## 2017-12-14 DIAGNOSIS — K5901 Slow transit constipation: Secondary | ICD-10-CM

## 2017-12-14 NOTE — Assessment & Plan Note (Signed)
No rectal tone. Unable to bear down. Stool has constant "oozing" causing skin maceration. Stool is soft, formed.

## 2017-12-14 NOTE — Progress Notes (Signed)
Location:      Place of Service:  SNF (31) Provider:  Toni Arthurs, NP-C  Blair Dolphin, MD  Patient Care Team: Blair Dolphin, MD as PCP - General (Pediatrics)  Extended Emergency Contact Information Primary Emergency Contact: Barnabas Lister Address: [place], [place] [address] [address] [place] Phone: [phone] Work Phone: [phone] Mobile Phone: [phone] Relation: Daughter Secondary Emergency Contact: Clayton Bibles Mobile Phone: [phone] Relation: Other  Code Status:  DNR Goals of care: Advanced Directive information Advanced Directives 12/11/2017  Does Patient Have a Medical Advance Directive? Yes  Type of Advance Directive Out of facility DNR (pink MOST or yellow form)  Does patient want to make changes to medical advance directive? No - Patient declined  Copy of [place] in Chart? -  Pre-existing out of facility DNR order (yellow form or pink MOST form) -     Chief Complaint  Patient presents with  . Medical Management of Chronic Issues    HPI:  Pt is a 84 y.o. female seen today for medical management of chronic diseases.    Stroke (cerebrum) ([place]) Catastrophic. Pt is non-verbal/ expressive aphasia. Left hemiplegia. Pt is scheduled to transition to LTC soon.   Dyssynergic constipation No rectal tone. Unable to bear down. Stool has constant "oozing" causing skin maceration. Stool is soft, formed.   Dermatitis associated with moisture Pt is incontinent of bowel and bladder. Red, irritated skin in the perineal area. Nickel sized open area on the right buttock related to moisture/ excoriation. Endit cream in use. No areas of pressure related breakdown.   Please note pt with limited verbal/cognitive ability. Unable to obtain complete ROS. Some ROS info obtained from staff and documentation.   Past Medical History:  Diagnosis Date  . Cataract cortical, senile   . Controlled diabetes  mellitus type II without complication ([place])    without mention of complication, not stated as uncontrolled  . Diabetes mellitus without complication ([place])   . Essential hypertension, benign   . GERD (gastroesophageal reflux disease)   . Hyperlipidemia    other and unspecified  . Hypertension   . PAC (premature atrial contraction)   . Stroke [facility])    Past Surgical History:  Procedure Laterality Date  . APPENDECTOMY    . CHOLECYSTECTOMY    . FRACTURE SURGERY     left ankle fracture  . TONSILLECTOMY      Allergies  Allergen Reactions  . Ace Inhibitors Other (See Comments)  . Tramadol Other (See Comments)    drowsiness    Allergies as of 12/14/2017      Reactions   Ace Inhibitors Other (See Comments)   Tramadol Other (See Comments)   drowsiness      Medication List        Accurate as of 12/14/17 11:46 PM. Always use your most recent med list.          acetaminophen 325 MG tablet Commonly known as:  TYLENOL Take 650 mg by mouth every 4 (four) hours as needed. for pain/ increased temp. May be administered orally, per G-tube if needed or rectally if unable to swallow (separate order). Maximum dose for 24 hours is 3,000 mg from all sources of Acetaminophen/ Tylenol   Alogliptin Benzoate 12.5 MG Tabs Take 12.5 mg by mouth daily.   apixaban 2.5 MG Tabs tablet Commonly known as:  ELIQUIS Take 1 tablet (2.5 mg total) by mouth 2 (  two) times daily.   atorvastatin 10 MG tablet Commonly known as:  LIPITOR Take 1 tablet (10 mg total) by mouth daily at 6 PM.   diltiazem 180 MG 24 hr capsule Commonly known as:  CARDIZEM CD Take 1 capsule (180 mg total) by mouth daily.   esomeprazole 40 MG capsule Commonly known as:  NEXIUM Take 40 mg by mouth daily.   glimepiride 4 MG tablet Commonly known as:  AMARYL Take 6 mg by mouth for diabetic treatment.See Instruction: Take 1 tablet in the morning. Take 0.5 tablet in the afternoon for diabetes   ICAPS PO Take 1 tablet by mouth  daily.   insulin regular 100 units/mL injection Commonly known as:  NOVOLIN R,HUMULIN R Inject 0-12 Units into the skin 3 (three) times daily before meals. Per Sliding Scale; If Blood Sugar is 176 to 250, give 3 Units.If Blood Sugar is 251 to 325, give 6 Units.If Blood Sugar is 326 to 450, give 10 Units.If Blood Sugar is greater than 450, give 12 Units.injection and recheck in one hour. If 451 on two consecutive occasions call MD.   metoprolol tartrate 25 MG tablet Commonly known as:  LOPRESSOR Take 1 tablet (25 mg total) by mouth 2 (two) times daily.   sertraline 50 MG tablet Commonly known as:  ZOLOFT Take 1 tablet (50 mg total) by mouth daily.   VITRON-C 65-125 MG Tabs Generic drug:  Iron-Vitamin C Take 1 tablet by mouth daily.       Review of Systems  Unable to perform ROS: Patient nonverbal  Constitutional: Negative for activity change, appetite change, chills, diaphoresis and fever.  HENT: Negative for congestion, mouth sores, nosebleeds, postnasal drip, sneezing, sore throat, trouble swallowing and voice change.   Respiratory: Negative for apnea, cough, choking, chest tightness, shortness of breath and wheezing.   Cardiovascular: Negative for chest pain, palpitations and leg swelling.  Gastrointestinal: Positive for constipation. Negative for abdominal distention, abdominal pain, diarrhea and nausea.  Genitourinary: Negative for difficulty urinating, dysuria, frequency and urgency.  Musculoskeletal: Positive for arthralgias (typical arthritis) and gait problem. Negative for back pain and myalgias.  Skin: Positive for rash. Negative for color change, pallor and wound.  Neurological: Positive for speech difficulty and weakness. Negative for dizziness, tremors, syncope, numbness and headaches.  Psychiatric/Behavioral: Positive for confusion. Negative for agitation and behavioral problems.  All other systems reviewed and are negative.   Immunization History  Administered  Date(s) Administered  . Influenza Split 05/02/2015  . Influenza, High Dose Seasonal PF 07/03/2017  . Influenza-Unspecified 06/07/2013, 05/03/2014, 05/02/2015, 05/08/2016  . Pneumococcal Conjugate-13 10/23/2016, 07/03/2017  . Pneumococcal Polysaccharide-23 12/02/2012  . Zoster 12/02/2011   Pertinent  Health Maintenance Due  Topic Date Due  . FOOT EXAM  11/17/1929  . OPHTHALMOLOGY EXAM  11/17/1929  . URINE MICROALBUMIN  11/17/[birth year]31  . DEXA SCAN  11/17/1986  . INFLUENZA VACCINE  03/11/2018  . HEMOGLOBIN A1C  05/15/2018  . PNA vac Low Risk Adult  Completed   No flowsheet data found. Functional Status Survey:    Vitals:   12/14/17 0644  BP: 129/63  Pulse: 67  Resp: 18  Temp: 97.6 F (36.4 C)  SpO2: 95%  Weight: 138 lb 6.4 oz (62.8 kg)   Body mass index is 23.03 kg/m. Physical Exam  Constitutional: Vital signs are normal. She appears well-developed and well-nourished. She is cooperative. She does not appear ill. No distress.  HENT:  Head: Normocephalic and atraumatic.  Mouth/Throat: Uvula is midline, oropharynx is clear and  moist and mucous membranes are normal. Mucous membranes are not pale, not dry and not cyanotic.  Eyes: Pupils are equal, round, and reactive to light. Conjunctivae, EOM and lids are normal.  Neck: Trachea normal, normal range of motion and full passive range of motion without pain. Neck supple. No JVD present. No tracheal deviation, no edema and no erythema present. No thyromegaly present.  Cardiovascular: Normal rate, normal heart sounds, intact distal pulses and normal pulses. An irregular rhythm present. Exam reveals no gallop, no distant heart sounds and no friction rub.  No murmur heard. Pulses:      Dorsalis pedis pulses are 2+ on the right side, and 2+ on the left side.  Generalized 1-2 + pitting edema (improved)  Pulmonary/Chest: Effort normal and breath sounds normal. No accessory muscle usage. No respiratory distress. She has no decreased breath  sounds. She has no wheezes. She has no rhonchi. She has no rales. She exhibits no tenderness.  Abdominal: Soft. Normal appearance and bowel sounds are normal. She exhibits no distension and no ascites. There is no tenderness.  Musculoskeletal: Normal range of motion. She exhibits no edema or tenderness.  Expected osteoarthritis, stiffness; Bilateral Calves soft, supple. Negative Homan's Sign. B- pedal pulses equal; left hemiplegia  Neurological: She is alert. She has normal strength. She displays atrophy. A cranial nerve deficit and sensory deficit is present. She exhibits abnormal muscle tone. Coordination and gait abnormal.  Skin: Skin is warm, dry and intact. She is not diaphoretic. No cyanosis. No pallor. Nails show no clubbing.     MAS-D in perineal area  Psychiatric: Her behavior is normal. Judgment and thought content normal. Her affect is blunt. Cognition and memory are impaired. She is noncommunicative. She exhibits abnormal recent memory and abnormal remote memory.  Nursing note and vitals reviewed.   Labs reviewed: Recent Labs    11/17/17 0553 11/20/17 0329 12/04/17 0530  NA 136 134* 135  K 3.9 4.3 3.7  CL 107 105 102  CO2 19* 20* 24  GLUCOSE 141* 169* 29*  BUN 39* 62* 47*  CREATININE 1.77* 2.21* 1.43*  CALCIUM 8.4* 8.4* 8.0*  MG  --   --  1.6*   Recent Labs    11/12/17 0918 12/04/17 0530  AST 24 25  ALT 15 13*  ALKPHOS 86 155*  BILITOT 0.7 0.6  PROT 6.0* 5.2*  ALBUMIN 3.2* 2.0*   Recent Labs    11/12/17 0918  11/17/17 0553 11/20/17 0329 12/04/17 0530  WBC 13.0*   < > 11.8* 15.3* 14.2*  NEUTROABS 10.2*  --   --  12.6* 11.3*  HGB 11.1*   < > 9.4* 11.0* 10.1*  HCT 33.3*   < > 28.7* 32.7* 30.8*  MCV 93.8   < > 93.2 92.9 93.5  PLT 252   < > 235 446* 501*   < > = values in this interval not displayed.   Lab Results  Component Value Date   TSH 7.144 (H) 12/04/2017   Lab Results  Component Value Date   HGBA1C 7.3 (H) 11/13/2017   Lab Results    Component Value Date   CHOL 99 12/04/2017   HDL 25 (L) 12/04/2017   LDLCALC 53 12/04/2017   TRIG 103 12/04/2017   CHOLHDL 4.0 12/04/2017    Significant Diagnostic Results in last 30 days:  Dg Abdomen 1 View  Result Date: 11/20/2017 CLINICAL DATA:  Evaluate constipation.  Urinary retention. EXAM: ABDOMEN - 1 VIEW COMPARISON:  None. FINDINGS: No bowel dilatation  to suggest obstruction. No evidence of free air. Moderate stool in the ascending, descending, and sigmoid colon. Stool within the rectum without abnormal distension. Multiple calcified granulomas in the spleen. There are vascular calcifications. Lower most lung bases are clear. Bones are under mineralized. IMPRESSION: Moderate colonic stool burden without fecal impaction or bowel obstruction. Electronically Signed   By: Jeb Levering M.D.   On: 11/20/2017 04:04   Dg Chest Port 1 View  Result Date: 11/16/2017 CLINICAL DATA:  Shortness of breath EXAM: PORTABLE CHEST 1 VIEW COMPARISON:  11/15/2017 FINDINGS: There is right lower lobe airspace disease concerning for pneumonia. There is no other focal parenchymal opacity. There is no pleural effusion or pneumothorax. The heart and mediastinal contours are unremarkable. The osseous structures are unremarkable. IMPRESSION: Right lower lobe airspace disease concerning for pneumonia. Electronically Signed   By: Kathreen Devoid   On: 11/16/2017 11:07   Dg Chest Port 1 View  Result Date: 11/15/2017 CLINICAL DATA:  Fever. EXAM: PORTABLE CHEST 1 VIEW COMPARISON:  None. FINDINGS: Mild patchy opacity in the right base. No pneumothorax. No nodules or masses. No other focal infiltrates. Cardiomegaly. The hila and mediastinum are normal. IMPRESSION: Patchy opacity in the right base is worrisome for mild pneumonia given history. Recommend follow-up to resolution. Electronically Signed   By: Dorise Bullion III M.D   On: 11/15/2017 15:39    Assessment/Plan Ronnetta was seen today for medical management of  chronic issues.  Diagnoses and all orders for this visit:  Cerebrovascular accident (CVA) due to embolism of right anterior cerebral artery ([place])  Dermatitis associated with moisture  Dyssynergic constipation   Continue current medication regimen  Continue use of Endit Cream BID and prn to areas of MAS-D in the perineal and buttocks areas  Continue PT/OT as able  Continue SLP as appropriate  Senna S 2 tablets po Q HS  Transfer to LTC when bed available  Continue Palliative Medicine   Family/ staff Communication:   Total Time:  Documentation:  Face to Face:  Family/Phone:   Labs/tests ordered:  Cbc, met c, Mag+  Medication list reviewed and assessed for continued appropriateness. Monthly medication orders reviewed and signed.  Vikki Ports, NP-C Geriatrics [facility] [phone], [place] [address] Cell Phone (Mon-Fri 8am-5pm):  [phone] On Call:  [phone] & follow prompts after 5pm & weekends Office Phone:  [phone] Office Fax:  [phone]

## 2017-12-14 NOTE — Assessment & Plan Note (Signed)
Pt is incontinent of bowel and bladder. Red, irritated skin in the perineal area. Nickel sized open area on the right buttock related to moisture/ excoriation. Endit cream in use. No areas of pressure related breakdown.

## 2017-12-14 NOTE — Assessment & Plan Note (Signed)
Catastrophic. Pt is non-verbal/ expressive aphasia. Left hemiplegia. Pt is scheduled to transition to LTC soon.

## 2017-12-15 ENCOUNTER — Other Ambulatory Visit
Admission: RE | Admit: 2017-12-15 | Discharge: 2017-12-15 | Disposition: A | Discharge status: Home / Self Care | Payer: No Typology Code available for payment source | Source: Ambulatory Visit | Attending: Gerontology | Admitting: Gerontology

## 2017-12-15 DIAGNOSIS — N399 Disorder of urinary system, unspecified: Secondary | ICD-10-CM | POA: Insufficient documentation

## 2017-12-15 DIAGNOSIS — R410 Disorientation, unspecified: Secondary | ICD-10-CM | POA: Insufficient documentation

## 2017-12-15 DIAGNOSIS — I69354 Hemiplegia and hemiparesis following cerebral infarction affecting left non-dominant side: Secondary | ICD-10-CM | POA: Insufficient documentation

## 2017-12-15 LAB — CBC WITH DIFFERENTIAL/PLATELET
BASOS PCT: 0 %
Basophils Absolute: 0 10*3/uL (ref 0–0.1)
EOS ABS: 0 10*3/uL (ref 0–0.7)
EOS PCT: 0 %
HCT: 31.7 % — ABNORMAL LOW (ref 35.0–47.0)
Hemoglobin: 10.3 g/dL — ABNORMAL LOW (ref 12.0–16.0)
LYMPHS ABS: 1.2 10*3/uL (ref 1.0–3.6)
Lymphocytes Relative: 4 %
MCH: 29.5 pg (ref 26.0–34.0)
MCHC: 32.4 g/dL (ref 32.0–36.0)
MCV: 91 fL (ref 80.0–100.0)
MONO ABS: 1.5 10*3/uL — AB (ref 0.2–0.9)
MONOS PCT: 5 %
Neutro Abs: 25.3 10*3/uL — ABNORMAL HIGH (ref 1.4–6.5)
Neutrophils Relative %: 91 %
Platelets: 645 10*3/uL — ABNORMAL HIGH (ref 150–440)
RBC: 3.49 MIL/uL — ABNORMAL LOW (ref 3.80–5.20)
RDW: 15.9 % — AB (ref 11.5–14.5)
WBC: 28 10*3/uL — ABNORMAL HIGH (ref 3.6–11.0)

## 2017-12-15 LAB — URINALYSIS, COMPLETE (UACMP) WITH MICROSCOPIC
Bilirubin Urine: NEGATIVE
GLUCOSE, UA: NEGATIVE mg/dL
HGB URINE DIPSTICK: NEGATIVE
Ketones, ur: NEGATIVE mg/dL
NITRITE: NEGATIVE
PH: 5 (ref 5.0–8.0)
PROTEIN: NEGATIVE mg/dL
Specific Gravity, Urine: 1.019 (ref 1.005–1.030)

## 2017-12-15 LAB — COMPREHENSIVE METABOLIC PANEL
ALBUMIN: 1.8 g/dL — AB (ref 3.5–5.0)
ALK PHOS: 195 U/L — AB (ref 38–126)
ALT: 12 U/L — AB (ref 14–54)
ANION GAP: 12 (ref 5–15)
AST: 30 U/L (ref 15–41)
BILIRUBIN TOTAL: 0.7 mg/dL (ref 0.3–1.2)
BUN: 91 mg/dL — AB (ref 6–20)
CALCIUM: 7.5 mg/dL — AB (ref 8.9–10.3)
CO2: 22 mmol/L (ref 22–32)
Chloride: 99 mmol/L — ABNORMAL LOW (ref 101–111)
Creatinine, Ser: 2.23 mg/dL — ABNORMAL HIGH (ref 0.44–1.00)
GFR calc Af Amer: 20 mL/min — ABNORMAL LOW (ref >60)
GFR calc non Af Amer: 17 mL/min — ABNORMAL LOW (ref >60)
Potassium: 4.7 mmol/L (ref 3.5–5.1)
Sodium: 133 mmol/L — ABNORMAL LOW (ref 135–145)
TOTAL PROTEIN: 5.1 g/dL — AB (ref 6.5–8.1)

## 2017-12-15 LAB — MAGNESIUM: Magnesium: 2.3 mg/dL (ref 1.7–2.4)

## 2017-12-17 LAB — BLOOD CULTURE ID PANEL (REFLEXED)
ACINETOBACTER BAUMANNII: NOT DETECTED
CANDIDA ALBICANS: NOT DETECTED
CANDIDA GLABRATA: NOT DETECTED
CANDIDA KRUSEI: NOT DETECTED
CANDIDA PARAPSILOSIS: NOT DETECTED
Candida tropicalis: NOT DETECTED
ENTEROBACTER CLOACAE COMPLEX: NOT DETECTED
ENTEROBACTERIACEAE SPECIES: NOT DETECTED
ENTEROCOCCUS SPECIES: NOT DETECTED
ESCHERICHIA COLI: NOT DETECTED
Haemophilus influenzae: NOT DETECTED
KLEBSIELLA OXYTOCA: NOT DETECTED
Klebsiella pneumoniae: NOT DETECTED
LISTERIA MONOCYTOGENES: NOT DETECTED
Methicillin resistance: DETECTED — AB
Neisseria meningitidis: NOT DETECTED
PSEUDOMONAS AERUGINOSA: NOT DETECTED
Proteus species: NOT DETECTED
STREPTOCOCCUS AGALACTIAE: NOT DETECTED
STREPTOCOCCUS PNEUMONIAE: NOT DETECTED
STREPTOCOCCUS PYOGENES: NOT DETECTED
Serratia marcescens: NOT DETECTED
Staphylococcus aureus (BCID): NOT DETECTED
Staphylococcus species: DETECTED — AB
Streptococcus species: NOT DETECTED

## 2017-12-17 LAB — URINE CULTURE: CULTURE: NO GROWTH

## 2017-12-19 LAB — CULTURE, BLOOD (ROUTINE X 2): Special Requests: ADEQUATE

## 2017-12-20 LAB — CULTURE, BLOOD (ROUTINE X 2)
Culture: NO GROWTH
Special Requests: ADEQUATE

## 2017-12-23 ENCOUNTER — Ambulatory Visit: Payer: Medicare Other | Admitting: Neurology

## 2018-01-09 ENCOUNTER — Encounter
Admission: RE | Admit: 2018-01-09 | Discharge: 2018-01-09 | Disposition: A | Discharge status: Home / Self Care | Payer: Medicare Other | Source: Ambulatory Visit | Attending: Internal Medicine | Admitting: Internal Medicine

## 2018-01-11 ENCOUNTER — Ambulatory Visit: Payer: Medicare Other | Admitting: Podiatry

## 2018-02-08 DEATH — deceased

## 2018-02-22 ENCOUNTER — Other Ambulatory Visit: Payer: Self-pay

## 2018-02-24 ENCOUNTER — Other Ambulatory Visit: Payer: Self-pay

## 2019-01-06 IMAGING — DX DG CHEST 1V PORT
1 series · 1 of 1 positions shown · non-contrast
Comparison: None.

CLINICAL DATA: Fever.

EXAM:
PORTABLE CHEST 1 VIEW

[chest ap]
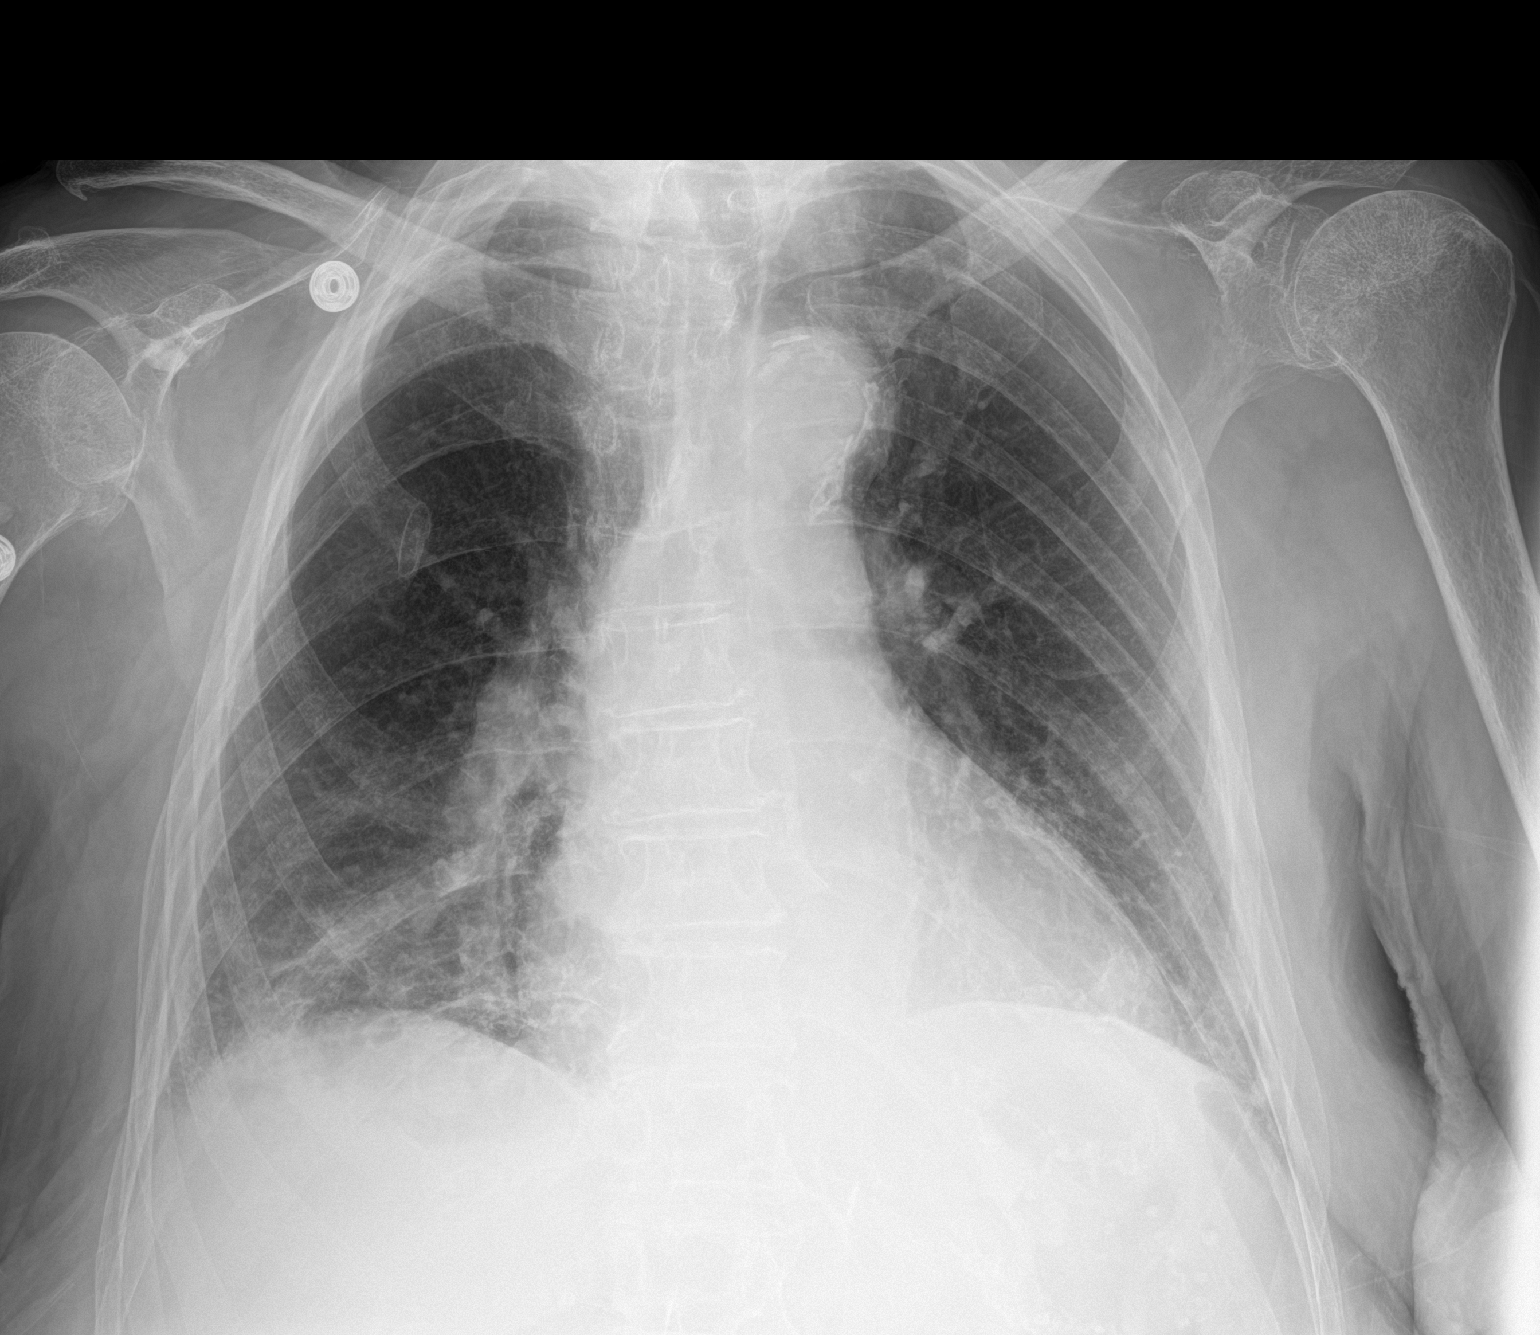

[1 of 1 positions shown; findings below may reference images not displayed]

FINDINGS: Mild patchy opacity in the right base. No pneumothorax. No nodules
or masses. No other focal infiltrates. Cardiomegaly. The hila and
mediastinum are normal.
IMPRESSION: Patchy opacity in the right base is worrisome for mild pneumonia
given history. Recommend follow-up to resolution.
